# Patient Record
Sex: Male | Born: 1965 | Race: Black or African American | Hispanic: No | Marital: Single | Smoking: Never smoker
Health system: Southern US, Community
[De-identification: ages and names within clinical notes are randomized; demographics above are authoritative.]

## PROBLEM LIST (undated history)

## (undated) HISTORY — PX: FOOT SURGERY: SHX648

## (undated) HISTORY — PX: JOINT REPLACEMENT: SHX530

---

## 2013-11-15 MED ADMIN — OLANZapine (ZyPREXA) tablet 2.5 mg: 2.5 mg | ORAL | @ 21:00:00 | NDC 00904628361

## 2013-11-15 MED ADMIN — diazepam (VALIUM) tablet 5 mg: 5 mg | ORAL | @ 02:00:00 | NDC 51079028501

## 2020-01-21 ENCOUNTER — Emergency Department: Admit: 2020-01-21 | Discharge: 2020-01-21 | Payer: Self-pay

## 2020-01-21 DIAGNOSIS — S99912A Unspecified injury of left ankle, initial encounter: Secondary | ICD-10-CM

## 2020-01-21 DIAGNOSIS — M25772 Osteophyte, left ankle: Secondary | ICD-10-CM

## 2020-01-21 DIAGNOSIS — M778 Other enthesopathies, not elsewhere classified: Secondary | ICD-10-CM

## 2020-01-21 DIAGNOSIS — M25472 Effusion, left ankle: Secondary | ICD-10-CM

## 2020-01-21 LAB — ECG 12-LEAD
Atrial Rate: 111 {beats}/min
Calculated P Axis: 46 degrees
Calculated R Axis: -20 degrees
Calculated T Axis: 39 degrees
P-R Interval: 178 ms
QRS Duration: 86 ms
QT Interval: 318 ms
QTcb: 432 ms
Ventricular Rate: 111 {beats}/min

## 2020-01-21 MED ORDER — LORAZEPAM 1 MG TABLET
1 | Freq: Once | ORAL | Status: AC
Start: 2020-01-21 — End: 2020-01-21
  Administered 2020-01-21: 14:00:00 2 mg via ORAL

## 2020-01-21 MED ORDER — LORAZEPAM 1 MG TABLET
1 | Freq: Once | ORAL | Status: AC
Start: 2020-01-21 — End: 2020-01-21
  Administered 2020-01-21: 12:00:00 2 mg via ORAL

## 2020-01-21 MED ORDER — LORAZEPAM 1 MG TABLET
1 | Freq: Once | ORAL | Status: AC
Start: 2020-01-21 — End: 2020-01-21
  Administered 2020-01-21: 10:00:00 1 mg via ORAL

## 2020-01-21 MED FILL — LORAZEPAM 1 MG TABLET: 1 mg | ORAL | Qty: 2

## 2020-01-21 MED FILL — LORAZEPAM 1 MG TABLET: 1 mg | ORAL | Qty: 1

## 2020-01-21 NOTE — Unmapped (Signed)
Please use crutches for the next 4-6 weeks and bear weight as tolerated on your left foot. Please see a primary doctor or urgent care for re-evaluation to determine how your ankle is recovering.    If you're feeling down or anxious, please consider dropping in to [place] or [facility] this morning.
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
## 2020-01-22 MED ORDER — ALUMINUM-MAG HYDROXIDE-SIMETHICONE 200 MG-200 MG-20 MG/5 ML ORAL SUSP: 200-200-20 mg/5 mL | ORAL | Status: DC

## 2020-01-22 MED ORDER — ELECTROLYTE-A IV BOLUS
INTRAVENOUS | Status: AC
  Administered 2020-01-23: 08:00:00 1000 mL via INTRAVENOUS

## 2020-01-22 MED ORDER — FAMOTIDINE 20 MG TABLET
20 mg | ORAL | Status: AC
  Administered 2020-01-23: 08:00:00 20 mg via ORAL

## 2020-01-23 LAB — VENOUS BLOOD GAS W/LACTATE
Base excess: 6 mmol/L
Bicarbonate: 32 mmol/L — ABNORMAL HIGH (ref 23–29)
Calcium, Ionized, whole blood: 1.15 mmol/L (ref 1.13–1.27)
Chloride, whole blood: 104 mmol/L (ref 99–108)
Glucose, whole blood: 104 mg/dL (ref 70–199)
Hematocrit from Hb: 50 % (ref 41–53)
Hemoglobin, Whole Blood: 16.3 g/dL (ref 13.6–17.5)
Lactate, whole blood: 2.2 mmol/L — ABNORMAL HIGH (ref 0.5–2.0)
Oxygen Saturation: 44 % — ABNORMAL LOW (ref 95–100)
PCO2: 56 mm Hg — ABNORMAL HIGH (ref 32–46)
PO2: 28 mm Hg — CL (ref 83–108)
Potassium, Whole Blood: 4 mmol/L (ref 3.4–4.5)
Sodium, whole blood: 141 mmol/L (ref 136–146)
pH, Blood: 7.36 (ref 7.35–7.45)

## 2020-01-23 LAB — COMPREHENSIVE METABOLIC PANEL
AST: 34 U/L (ref 5–44)
Alanine transaminase: 25 U/L (ref 10–61)
Albumin, Serum / Plasma: 3.7 g/dL (ref 3.5–5.0)
Alkaline Phosphatase: 69 U/L (ref 38–108)
Anion Gap: 10 (ref 4–14)
Bilirubin, Total: 0.5 mg/dL (ref 0.2–1.2)
Calcium, total, Serum / Plasma: 8.9 mg/dL (ref 8.4–10.5)
Carbon Dioxide, Total: 26 mmol/L (ref 22–29)
Chloride, Serum / Plasma: 104 mmol/L (ref 101–110)
Creatinine: 0.95 mg/dL (ref 0.73–1.24)
Glucose, non-fasting: 108 mg/dL (ref 70–199)
Potassium, Serum / Plasma: 4.2 mmol/L (ref 3.5–5.0)
Protein, Total, Serum / Plasma: 7.2 g/dL (ref 6.3–8.6)
Sodium, Serum / Plasma: 140 mmol/L (ref 135–145)
Urea Nitrogen, Serum / Plasma: 7 mg/dL (ref 7–25)
eGFR - high estimate: 105 mL/min (ref >59)
eGFR - low estimate: 90 mL/min (ref >59)

## 2020-01-23 LAB — ED INFORMATION EXCHANGE ORDER: EDIE Other: 1

## 2020-01-23 LAB — URINALYSIS WITH REFLEX TO CULT
Bilirubin, Urine: NEGATIVE
Glucose, (UA): NEGATIVE mg/dL
Hemoglobin (UA): NEGATIVE
Ketones, UA: NEGATIVE mg/dL
Nitrite: NEGATIVE
Protein, UA: NEGATIVE mg/dL
Specific Gravity: 1.012 (ref 1.002–1.030)
Urobilinogen: NEGATIVE mg/dL(EU/dL)
WBC Esterase: NEGATIVE
pH, UA: 6 (ref 4.5–8.0)

## 2020-01-23 LAB — COMPLETE BLOOD COUNT WITH DIFF
Abs Basophils: 0.01 10*9/L (ref 0.00–0.10)
Abs Eosinophils: 0.22 10*9/L (ref 0.00–0.40)
Abs Imm Granulocytes: 0.01 10*9/L (ref <0.10)
Abs Lymphocytes: 1.7 10*9/L (ref 1.00–3.40)
Abs Monocytes: 0.93 10*9/L — ABNORMAL HIGH (ref 0.20–0.80)
Abs Neutrophils: 2.07 10*9/L (ref 1.80–6.80)
Hematocrit: 46.3 % (ref 41.0–53.0)
Hemoglobin: 15.1 g/dL (ref 13.6–17.5)
MCH: 31.1 pg (ref 26.0–34.0)
MCHC: 32.6 g/dL (ref 31.0–36.0)
MCV: 96 fL (ref 80–100)
Platelet Count: 136 10*9/L — ABNORMAL LOW (ref 140–450)
RBC Count: 4.85 10*12/L (ref 4.40–5.90)
WBC Count: 4.9 10*9/L (ref 3.4–10.0)

## 2020-01-23 LAB — LIPASE: Lipase: 26 U/L (ref 8–78)

## 2020-01-23 LAB — DRUGS OF ABUSE SCREEN, RAPID
Amphetamine Screen, Urine: POSITIVE — AB
Barbiturates Screen, Urine: NEGATIVE
Benzodiazepines Screen, Urine: NEGATIVE
Cocaine Screen, Urine: POSITIVE — AB
Opiates Screen, Urine: NEGATIVE
Tetrahydrocannabinol Screen, U: NEGATIVE

## 2020-01-23 LAB — ACETAMINOPHEN LEVEL: Acetaminophen: <1 mg/L — ABNORMAL LOW (ref 10–20)

## 2020-01-23 LAB — MAGNESIUM, SERUM / PLASMA: Magnesium, Serum / Plasma: 2 mg/dL (ref 1.6–2.6)

## 2020-01-23 LAB — THYROID STIMULATING HORMONE: Thyroid Stimulating Hormone: 1.51 mIU/L (ref 0.45–4.12)

## 2020-01-23 LAB — POCT COVID-19 RNA, QUALITATIVE: POCT COVID-19 RNA, Qualitative: NOT DETECTED

## 2020-01-23 LAB — SALICYLATE LEVEL: Salicylate: <5 mg/dL — ABNORMAL LOW (ref 10–30)

## 2020-01-23 LAB — TROPONIN I: Troponin I: 0.03 ug/L (ref 0.00–0.04)

## 2020-01-23 LAB — ETHANOL, SERUM OR PLASMA: Ethanol, serum or plasma: 0.065 g/dL — ABNORMAL HIGH (ref <0.010)

## 2020-01-23 MED ORDER — LORAZEPAM 1 MG TABLET
1 mg | ORAL | Status: AC
  Administered 2020-01-23: 09:00:00 1 mg via ORAL

## 2020-01-23 MED ORDER — RISPERIDONE 1 MG DISINTEGRATING TABLET
1 mg | ORAL | Status: AC
  Administered 2020-01-23: 08:00:00 2 mg via ORAL

## 2020-01-23 MED FILL — FAMOTIDINE 20 MG TABLET: 20 mg | ORAL | Qty: 1

## 2020-01-23 MED FILL — LORAZEPAM 1 MG TABLET: 1 mg | ORAL | Qty: 1

## 2020-01-23 MED FILL — RISPERIDONE 1 MG DISINTEGRATING TABLET: 1 mg | ORAL | Qty: 2

## 2020-01-23 NOTE — Interdisciplinary (Signed)
EDIE / Psych ED Social Work Note    Referral:   5150    Data:   Pt has presented to ED repeatedly in past several days reporting somatic symptoms and possible paranoid ideation; housed in [place] with his sister.    No Medi-cal or insurance currently    Assessment:   Patient is medically cleared. Re-evaluated by psychiatry today who are recommending discontinuation of the 5150 and follow up in the community.    SW spoke with patient to review insurance and follow-up plan. Patient was alert, calm, oriented x 4 and able to communicate well and in a linear fashion. Patient feeling quite stable and safe to d/c with no SI or HI.  Patient states he has begun the application process for [place]Cal and is in process of getting linked for outpatient psychiatry there in that county. Pt anticipates having active MediCal by 01/28/20. Patient was provided with emergency crisis lines and the [facility] [employer] for follow-up, and was appreciative of SW support in referring to services.     Action / Follow-up plan:   SW initiated referral with ACCESS team for [place] who took the info and will contact patient at his sister's home number to complete clinical review and schedule the appointment.    Pt provided with county resources, including crisis lines for 24/7 support, and with a BART ticket $6.80 value and a token to get back to his sister's home.       Catha Gosselin, LCSW  Emergency Department  Psychiatric Social Worker   Voalte: [phone] or Desk: [phone]
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
## 2020-01-23 NOTE — Other (Signed)
State of [employer] of Health Care Services  Health and Hospital doctor     APPLICATION FOR UP TO 72-HOUR ASSESSMENT, EVALUATION, AND CRISIS INTERVENTION OR PLACEMENT FOR EVALUATION AND TREATMENT  Confidential Client/Patient Information  Welfare and Institutions Code (W & I Code), section 5150 (g)(1), requires that each person, at the time they are first taken into custody under this section, shall be provided, by the person who takes them into custody, the following information orally in a language or modality accessible to the person. If the person cannot understand an oral advisement, the information shall be provided in writing. DETAINMENT ADVISEMENT  My name is ______________________________  I am a (peace officer/mental health professional) with (name of agency). You are not under criminal arrest, but I am taking you for examination by mental health professionals at (name of facility).    You will be told your rights by the mental health staff.     If taken into custody at his or her residence, the person shall also be told the following information:  You may bring a few personal items with you which I will have to approve.Please inform me if you need   [X]  Complete Advisement  [  ] Incomplete Advisement   assistance turning off any appliance or water. You may make a phone call and leave a note to tell your friends or family where you have been taken.   Date of Advisement/Attempt:   01/23/2020     Good Cause for Incomplete Advisement:   N/A          Advisement Completed/ Attempted By:     (Electronically signed by)  01/25/2020 Position:   Resident physician Language or Modality Used:  Spoken English                                        To (name of 5150 designated facility):  [facility] and [facility] at [facility]    Application is hereby made for the assessment and evaluation of Tyler Bailey,  date of birth of 10-02-1965, and residing at [address], [place], [place], [place], [address], for up to 72-hour assessment, evaluation, and crisis intervention, or placement for evaluation and treatment at a designated facility pursuant to Section 5150, et seq. (adult) or Section 5585 et seq. (minor), of the W&I Code.    If authorization for voluntary treatment is not available for a minor/conservatee, indicate to the best of your knowledge who has legal authority to make medical decisions on behalf of the minor/conservatee: (name and contact information, if available)      (Check one): [  ] Parent   [  ]  Legal Guardian  [  ]  Conservator   [  ]  Other:    _       Indicate to the best of your knowledge whether the minor is under the jurisdiction of the juvenile court:    (Check one):   [  ]  W&I Code 300 (dependent)   [  ]  W&I Code 601, 602 (ward)    The detained persons condition was called to my attention under the following circumstances: [facility] consultation to psychiatry. Additional details: Emergency department consult for paranoia/anxiety   Specific facts that I have considered that lead me to believe that this person is, as a result of a mental health disorder, a danger to others, a danger to self or gravely disabled:   Mr. Bruun has been unable to reliably keep himself out of the hospital over the past 72 hours due to ongoing intense paranoia, audio and visual hallucinations, and also endorses periodic SI without plan   [X]  I have considered the historical course of the person's mental disorder as follows: Patient has presented for emergency care each of the past 3 nights. He endorses a history of bipolar disorder and repeated psychiatric hospitalization in . He has not established care with a psychiatrist since moving back to the [facility].  [  ] No reasonable bearing on determination  [  ] No information available because:  OPTIONAL INFORMATION    History Provided by  (Name)                     Address      Phone Number          Relation                        Based upon the above information, there is probable cause to believe that said person is, as a result of mental health disorder:   [  ] Danger to Self (DTS)       [  ]  Danger to Others (DTO)       [X]  Gravely  Disabled (as defined in W&I Code section 5008 or 5585.25     NOTIFICATIONS TO BE PROVIDED PURSUANT TO SECTION 5152.1 AND/OR 8102 OF THE WELFARE AND INSTUTIONS CODE   Notify behavioral health director/designee: ________________________________________ (Name) (Phone) and peace officer/designee:      __________________________________________________(Name) (Phone) of persons release or end of detention if either of the boxes below are checked.     NOTIFICATION OF PERSONS RELEASE IS REQUESTED BY THE REFERRING PEACE OFFICER BECAUSE:     []   The person has been referred to the facility under circumstances which, based upon an allegation of facts regarding actions witnessed by the officer or another person, would support the filing of a criminal complaint.     []   Weapon was [facility] to Section 8102 W&I Code.     Signature, title and badge number of , professional person in charge of the facility designated by the county for evaluation and treatment, member of the attending staff, designated members of a mobile crisis team, or professional person designated by .    Name:  Date: 01/23/2020    Phone:  [phone]     Signature:  (Electronically signed by)  [employer] Title:  MD Eugenio Hoes      N/A Time:   1:29 AM    Name of Law Enforcement Agency or Evaluation Facility/Person:   01/25/2020 and Clin[facility] at [facility]   Address of Eugenio Hoes or Evaluation Facility/Person:   [address]  [place], [facility] Sales executive      REFERENCES   Notify (officer/unit & telephone #) N/A     Welfare  and Institutions Code    Sections: 981, 191, 478,  2956, 2130, 5150.05, 5152.1, 8657, [id number], 5585.50, [place]   Name of Individual Detained:    Tyler Bailey         DOB:    [birth year]      DHCS 1801      Please Note: A copy of this application shall be treated as the original. (Revised 08/2018)

## 2020-01-23 NOTE — ED Provider Notes (Signed)
ED Attendings              History     Chief Complaint   Patient presents with    Anxiety     pt biba 2/2 panic attack. -SI/HI       HPI  75yoM pmh AUD c/b pancreatitis? P/w abdominal pain, chest discomfort intermittent throughout day     Describes gets pressure and feels anxious    Abdominal pain and chest pain in same area  Intermittent  Sharp  Nothing improves  Anxiety episodes worsen  Believes his pancreatitis  Drinks 2 6 packs a day for the last several months  No shortness of breath, fevers  +emesis whatever he ate  No surgical abdominal history  +hematuria - streaks with some  Discomfort  Normal bowel movements    Occasional HI but does not say who  SI has tried to hurt self before but denies today  +paranoia, people trying to kill him why he left [place] and came in Jan  Lives with sister and feels safe - apt in [place]      Allergies/Contraindications  No Known Allergies             Medical History    Past Medical History   Diagnosis Date    Alcoholism     Exposure to hepatitis C     Medial epicondylitis of elbow     Osteoarthritis of wrist     Osteoarthrosis, unspecified whether generalized or localized, forearm     Osteoarthrosis, unspecified whether generalized or localized, lower leg     Vitamin D deficiency                  Social History     Socioeconomic History    Marital status: Single     Spouse name: Not on file    Number of children: Not on file    Years of education: Not on file    Highest education level: Not on file   Tobacco Use    Smoking status: Never Smoker    Smokeless tobacco: Never Used   Substance and Sexual Activity    Alcohol use: Yes     Alcohol/week: 18.0 standard drinks     Types: 18 Cans of beer per week     Comment: 3 6-packs daily.     Drug use: Yes     Types: Cocaine, Methamphetamine     Comment: smokes (prior cocaine)        Review of Systems     Review of Systems   Constitutional: Negative for chills and fever.   HENT: Negative for rhinorrhea and sore  throat.    Eyes: Negative for discharge and redness.   Respiratory: Negative for cough and shortness of breath.    Cardiovascular: Positive for chest pain. Negative for palpitations.   Gastrointestinal: Positive for abdominal pain and vomiting. Negative for blood in stool, constipation, diarrhea and nausea.   Genitourinary: Positive for dysuria and hematuria.   Skin: Negative for wound.   Neurological: Negative for dizziness and headaches.   Psychiatric/Behavioral: Negative for confusion, self-injury and suicidal ideas.       Physical Exam   Triage Vital Signs:  BP: (!) 152/98, Heart Rate: 112, Temp: 37 C (98.6 F), *Resp: 18, SpO2: 99 %    Physical Exam  Constitutional:       General: He is not in acute distress.     Appearance: He is not toxic-appearing.  HENT:      Head: Atraumatic.   Eyes:      Pupils: Pupils are equal, round, and reactive to light.   Cardiovascular:      Rate and Rhythm: Regular rhythm. Tachycardia present.      Pulses: Normal pulses.      Heart sounds: Normal heart sounds.   Pulmonary:      Effort: Pulmonary effort is normal. No tachypnea, accessory muscle usage or respiratory distress.      Breath sounds: No stridor. No decreased breath sounds or rales.   Chest:      Chest wall: No tenderness.   Abdominal:      General: Bowel sounds are normal. There is no distension.      Palpations: Abdomen is soft.      Tenderness: There is abdominal tenderness (mid epigastric).   Musculoskeletal:      Right lower leg: No edema.      Left lower leg: No edema.   Skin:     General: Skin is warm and dry.   Neurological:      Mental Status: He is alert and oriented to person, place, and time.   Psychiatric:      Comments: Possible paranoia, asks why in hallway, can he be somewhere more private  Describes people trying to kill him  Makes him anxious and gets these attacks            Initial Assessment (problem list and differential diagnosis)     53yoM pmh AUD c/b pancreatitis? P/w abdominal pain, chest  discomfort intermittent throughout day.    Abdominal pain.chest discomfort: gastritis, hepatitis, choley, pancreatitis, ACS, anxiety, thyroid disease. PUD possible but no signs of bleeding at this time. Doubt PE, dissection. No tremors, tongue wag doubt withdrawal    Paranoia: underlying psych, elicit drug use. Considered wernicke's but no confabulation, normal gait.     - labs, ekg  - psych consult for representation and HI with some paranoid thoughs    Interpretations:  Lab, Imaging, and ECG     Labs are pending.    As below       Reassessment and Final Disposition         ED Course as of Jan 23 1449   [facility] Documentation   Mon Jan 23, 2020   0002 Paged psych      [phone] Referrals OP psych. Has history bipolar disorder.   - Risperdal 2mg   - staffing with attending      0117 compensated   PCO2(!): 56   0119 Within patients range   Platelet Count(!): 136   0127 Psych: attending concerned cant get collateral, bounce back.   - placing 5150 gd      0433 Cocaine Screen, Urine(!): POS   0433 Amphetamine Screen, Urine(!): POS      Others' Documentation   Mon Jan 23, 2020   0712 S54M on 5150 for GD iso bipolar not on meds. Med cleared.  Psych reeval []       [EC]   5151 Cleared by psych. No meds. SW working on referral for Mental Health    [EC]      ED Course User Index  [EC] [id number], MD         [employer], MD  Resident  01/24/20 [address] [place], [address]  01/28/20 [phone]

## 2020-01-23 NOTE — Other (Signed)
State of [place] - Health and Investment banker, corporate of Health Care Services   INVOLUNTARY PATIENT ADVISEMENT  (TO BE READ AND GIVEN TO THE  PATIENT AT TIME OF ADMISSION) Confidential Patient Information   Name of Facility     [facility] at [place]   Patients Name     Tyler Bailey Admission Date:     01/23/2020   Section 5150(h) of the Welfare and Institutions Code requires that each person admitted to a facility designated by [employer] for evaluation and treatment be given specific information orally and in writing, and in a language or modality accessible to the person and a record of the advisement be kept in the persons medical record.   My name is Eugenio Hoes My position here is Resident physician   You are being placed in this psychiatric facility because it is our professional opinion, that as a result of a mental health disorder, you are likely to: (check applicable)   [  ] Harm yourself [  ] Harm someone else [X]  Be unable to be take care of your own food clothing or shelter   (List specific facts upon which the allegation of dangerous or gravely disabled due to mental health disorder is based, including pertinent facts arising from the admission interview):   We believe this is true because  You describe intense feelings of anxiety and audio/visual hallucinations that have resulted in your visiting the emergency room repeatedly and interfered with your ability to take care of yourself outside the hospital   You will be held for a period of up to 72 hours. This ( [x]  does ) ( []  does not) include weekends or holidays.   Your 72-hour period begins at:      01:29 AM   on:          01/23/2020                                                             (Time)                (Date)       You will be held for a period up to 72 hours. During the 72 hours you may also be transferred to another facility. You may request to be evaluated or treated at a  facility of your choice. You may request to be evaluated or treated by a mental health professional of your choice. We cannot guarantee the facility or mental health professional you choose will be available, but we will honor your choice if we can.     During these 72 hours you will be evaluated by the facility staff, and you may be given treatment, including medications. It is possible for you to be released before the end of the 72 hours. But if the staff decides that you need continued treatment you can be held for a longer period of time. If you are held longer than 72 hours, you have the right to a lawyer and a qualified interpreter and a hearing before a judge. If you are unable to pay for the lawyer, then one will be provided to you free of charge.     If you have questions about your legal rights, you may  contact the county Patients Rights Advocate at [phone] or [phone] (phone number of county Patients Rights Advocacy Office).   Advisement Completed or Attempted by:  (Electronically signed by)  Solon Palm Position:  Resident physician Language or Modality Used:  Spoken English    Good cause for Incomplete Advisement N/A Date of Advisement:  01/23/20   CC: Original to the Patient   DHCS 1802 (08/2018) Copy to the Patients Record

## 2020-01-24 LAB — ECG 12-LEAD
Atrial Rate: 99 {beats}/min
Calculated P Axis: 49 degrees
Calculated R Axis: -25 degrees
Calculated T Axis: 35 degrees
P-R Interval: 164 ms
QRS Duration: 86 ms
QT Interval: 364 ms
QTcb: 467 ms
Ventricular Rate: 99 {beats}/min

## 2020-03-09 ENCOUNTER — Emergency Department: Admit: 2020-03-09 | Discharge: 2020-03-09 | Payer: MEDICAID

## 2020-03-09 ENCOUNTER — Inpatient Hospital Stay: Admit: 2020-03-09 | Discharge: 2020-03-09 | Payer: MEDICAID

## 2020-03-09 ENCOUNTER — Inpatient Hospital Stay
Admit: 2020-03-09 | Discharge: 2020-03-11 | Discharge status: Against Medical Advice | Payer: MEDICAID | Attending: Physician | Admitting: Physician

## 2020-03-09 DIAGNOSIS — F2081 Schizophreniform disorder: Secondary | ICD-10-CM

## 2020-03-09 DIAGNOSIS — F10239 Alcohol dependence with withdrawal, unspecified: Secondary | ICD-10-CM

## 2020-03-09 DIAGNOSIS — R45851 Suicidal ideations: Secondary | ICD-10-CM

## 2020-03-09 DIAGNOSIS — F29 Unspecified psychosis not due to a substance or known physiological condition: Secondary | ICD-10-CM

## 2020-03-09 DIAGNOSIS — Z205 Contact with and (suspected) exposure to viral hepatitis: Secondary | ICD-10-CM

## 2020-03-09 DIAGNOSIS — Z818 Family history of other mental and behavioral disorders: Secondary | ICD-10-CM

## 2020-03-09 DIAGNOSIS — R1013 Epigastric pain: Secondary | ICD-10-CM

## 2020-03-09 DIAGNOSIS — M19039 Primary osteoarthritis, unspecified wrist: Secondary | ICD-10-CM

## 2020-03-09 DIAGNOSIS — M1711 Unilateral primary osteoarthritis, right knee: Secondary | ICD-10-CM

## 2020-03-09 DIAGNOSIS — Z20822 Contact with and (suspected) exposure to covid-19: Secondary | ICD-10-CM

## 2020-03-09 DIAGNOSIS — F10230 Alcohol dependence with withdrawal, uncomplicated: Secondary | ICD-10-CM

## 2020-03-09 DIAGNOSIS — I10 Essential (primary) hypertension: Secondary | ICD-10-CM

## 2020-03-09 DIAGNOSIS — R000 Tachycardia, unspecified: Secondary | ICD-10-CM

## 2020-03-09 DIAGNOSIS — R079 Chest pain, unspecified: Secondary | ICD-10-CM

## 2020-03-09 DIAGNOSIS — F10231 Alcohol dependence with withdrawal delirium: Secondary | ICD-10-CM

## 2020-03-09 DIAGNOSIS — R9431 Abnormal electrocardiogram [ECG] [EKG]: Secondary | ICD-10-CM

## 2020-03-09 DIAGNOSIS — F1020 Alcohol dependence, uncomplicated: Secondary | ICD-10-CM

## 2020-03-09 DIAGNOSIS — D6959 Other secondary thrombocytopenia: Secondary | ICD-10-CM

## 2020-03-09 DIAGNOSIS — F419 Anxiety disorder, unspecified: Secondary | ICD-10-CM

## 2020-03-09 DIAGNOSIS — K2921 Alcoholic gastritis with bleeding: Secondary | ICD-10-CM

## 2020-03-09 DIAGNOSIS — K5900 Constipation, unspecified: Secondary | ICD-10-CM

## 2020-03-09 DIAGNOSIS — F39 Unspecified mood [affective] disorder: Secondary | ICD-10-CM

## 2020-03-09 DIAGNOSIS — D7589 Other specified diseases of blood and blood-forming organs: Secondary | ICD-10-CM

## 2020-03-09 DIAGNOSIS — F14120 Cocaine abuse with intoxication, uncomplicated: Secondary | ICD-10-CM

## 2020-03-09 DIAGNOSIS — E559 Vitamin D deficiency, unspecified: Secondary | ICD-10-CM

## 2020-03-09 DIAGNOSIS — F319 Bipolar disorder, unspecified: Secondary | ICD-10-CM

## 2020-03-09 DIAGNOSIS — G5603 Carpal tunnel syndrome, bilateral upper limbs: Secondary | ICD-10-CM

## 2020-03-09 DIAGNOSIS — F15120 Other stimulant abuse with intoxication, uncomplicated: Secondary | ICD-10-CM

## 2020-03-09 DIAGNOSIS — F458 Other somatoform disorders: Secondary | ICD-10-CM

## 2020-03-09 LAB — VENOUS BLOOD GAS W/LACTATE
Base excess: 0 mmol/L
Base excess: 4 mmol/L
Bicarbonate: 23 mmol/L (ref 23–29)
Bicarbonate: 30 mmol/L — ABNORMAL HIGH (ref 23–29)
Calcium, Ionized, whole blood: 1.17 mmol/L (ref 1.13–1.27)
Calcium, Ionized, whole blood: 1.14 mmol/L (ref 1.13–1.27)
Chloride, whole blood: 102 mmol/L (ref 99–108)
Chloride, whole blood: 100 mmol/L (ref 99–108)
Date Called:: 20210611
Glucose, whole blood: 83 mg/dL (ref 70–199)
Glucose, whole blood: 78 mg/dL (ref 70–199)
Hematocrit from Hb: 47 % (ref 41–53)
Hematocrit from Hb: 45 % (ref 41–53)
Hemoglobin, Whole Blood: 15.4 g/dL (ref 13.6–17.5)
Hemoglobin, Whole Blood: 14.8 g/dL (ref 13.6–17.5)
Lactate, whole blood: 6 mmol/L — ABNORMAL HIGH (ref 0.5–2.0)
Lactate, whole blood: 1.4 mmol/L (ref 0.5–2.0)
Oxygen Saturation: 86 % — ABNORMAL LOW (ref 95–100)
Oxygen Saturation: 85 % — ABNORMAL LOW (ref 95–100)
PCO2: 30 mm Hg — ABNORMAL LOW (ref 32–46)
PCO2: 52 mm Hg — ABNORMAL HIGH (ref 32–46)
PO2: 47 mm Hg — ABNORMAL LOW (ref 83–108)
PO2: 53 mm Hg — ABNORMAL LOW (ref 83–108)
Potassium, Whole Blood: 3.9 mmol/L (ref 3.4–4.5)
Potassium, Whole Blood: 3.8 mmol/L (ref 3.4–4.5)
Sodium, whole blood: 140 mmol/L (ref 136–146)
Sodium, whole blood: 138 mmol/L (ref 136–146)
Time Called:: 71000
pH, Blood: 7.49 — ABNORMAL HIGH (ref 7.35–7.45)
pH, Blood: 7.36 (ref 7.35–7.45)

## 2020-03-09 LAB — PROTHROMBIN TIME
Int'l Normaliz Ratio: 1.1 (ref 0.9–1.2)
PT: 13.7 s (ref 11.7–14.9)

## 2020-03-09 LAB — COMPREHENSIVE METABOLIC PANEL
AST: 28 U/L (ref 5–44)
Alanine transaminase: 30 U/L (ref 10–61)
Albumin, Serum / Plasma: 4.2 g/dL (ref 3.5–5.0)
Alkaline Phosphatase: 62 U/L (ref 38–108)
Anion Gap: 17 — ABNORMAL HIGH (ref 4–14)
Bilirubin, Total: 0.5 mg/dL (ref 0.2–1.2)
Calcium, total, Serum / Plasma: 9.4 mg/dL (ref 8.4–10.5)
Carbon Dioxide, Total: 18 mmol/L — ABNORMAL LOW (ref 22–29)
Chloride, Serum / Plasma: 103 mmol/L (ref 101–110)
Creatinine: 1.01 mg/dL (ref 0.73–1.24)
Glucose, non-fasting: 83 mg/dL (ref 70–199)
Potassium, Serum / Plasma: 4.1 mmol/L (ref 3.5–5.0)
Protein, Total, Serum / Plasma: 7.5 g/dL (ref 6.3–8.6)
Sodium, Serum / Plasma: 138 mmol/L (ref 135–145)
Urea Nitrogen, Serum / Plasma: 9 mg/dL (ref 7–25)
eGFR - high estimate: 97 mL/min (ref >59)
eGFR - low estimate: 84 mL/min (ref >59)

## 2020-03-09 LAB — COVID-19 RNA, RT-PCR/NUCLEIC A: COVID-19 RNA, RT-PCR/Nucleic A: NOT DETECTED

## 2020-03-09 LAB — TROPONIN I: Troponin I: <0.02 ug/L (ref 0.00–0.04)

## 2020-03-09 LAB — COMPLETE BLOOD COUNT WITH DIFF
Abs Basophils: 0.02 10*9/L (ref 0.00–0.10)
Abs Eosinophils: 0.01 10*9/L (ref 0.00–0.40)
Abs Imm Granulocytes: 0.02 10*9/L (ref <0.10)
Abs Lymphocytes: 1.81 10*9/L (ref 1.00–3.40)
Abs Monocytes: 1.05 10*9/L — ABNORMAL HIGH (ref 0.20–0.80)
Abs Neutrophils: 3.86 10*9/L (ref 1.80–6.80)
Hematocrit: 45.1 % (ref 41.0–53.0)
Hemoglobin: 15 g/dL (ref 13.6–17.5)
MCH: 32.1 pg (ref 26.0–34.0)
MCHC: 33.3 g/dL (ref 31.0–36.0)
MCV: 96 fL (ref 80–100)
Platelet Count: 121 10*9/L — ABNORMAL LOW (ref 140–450)
RBC Count: 4.68 10*12/L (ref 4.40–5.90)
WBC Count: 6.8 10*9/L (ref 3.4–10.0)

## 2020-03-09 LAB — DRUGS OF ABUSE SCREEN, RAPID
Amphetamine Screen, Urine: POSITIVE — AB
Barbiturates Screen, Urine: NEGATIVE
Benzodiazepines Screen, Urine: NEGATIVE
Cocaine Screen, Urine: POSITIVE — AB
Opiates Screen, Urine: NEGATIVE
Tetrahydrocannabinol Screen, U: NEGATIVE

## 2020-03-09 LAB — LIPASE: Lipase: 16 U/L (ref 8–78)

## 2020-03-09 LAB — ED INFORMATION EXCHANGE ORDER: EDIE Other: 1

## 2020-03-09 LAB — MAGNESIUM, SERUM / PLASMA: Magnesium, Serum / Plasma: 1.7 mg/dL (ref 1.6–2.6)

## 2020-03-09 MED ORDER — DEXTROSE 5 % AND 0.9 % NACL IV BOLUS
Freq: Once | INTRAVENOUS | Status: AC
Start: 2020-03-09 — End: 2020-03-09
  Administered 2020-03-09: 21:00:00 1000 mL via INTRAVENOUS

## 2020-03-09 MED ORDER — LORAZEPAM 2 MG/ML INJECTION SOLUTION
2 mg/mL | INTRAMUSCULAR | Status: AC
  Administered 2020-03-09: 15:00:00 2 mg via INTRAVENOUS

## 2020-03-09 MED ORDER — FOLIC ACID 1 MG TABLET
1 mg | ORAL | Status: DC
  Administered 2020-03-10: 16:00:00 1 mg via ORAL

## 2020-03-09 MED ORDER — THIAMINE HCL (VITAMIN B1) 100 MG/ML INJECTION SOLUTION
100 mg/mL | Freq: Three times a day (TID) | INTRAMUSCULAR | Status: DC
Start: 2020-03-09 — End: 2020-03-10
  Administered 2020-03-10 – 2020-03-11 (×4): 500 mg via INTRAVENOUS

## 2020-03-09 MED ORDER — IOHEXOL 350 MG IODINE/ML INTRAVENOUS SOLUTION
350 | Freq: Once | INTRAVENOUS | Status: AC
Start: 2020-03-09 — End: 2020-03-09
  Administered 2020-03-09: 150 mL via INTRAVENOUS

## 2020-03-09 MED ORDER — ELECTROLYTE-A IV BOLUS
INTRAVENOUS | Status: AC
  Administered 2020-03-09: 15:00:00 1000 mL via INTRAVENOUS

## 2020-03-09 MED ORDER — THIAMINE HCL (VITAMIN B1) 100 MG/ML INJECTION SOLUTION
100 mg/mL | INTRAMUSCULAR | Status: AC
  Administered 2020-03-09: 16:00:00 100 mg via INTRAVENOUS

## 2020-03-09 MED ORDER — ONDANSETRON HCL (PF) 4 MG/2 ML INJECTION SOLUTION
4 mg/2 mL | INTRAMUSCULAR | Status: AC | PRN
  Administered 2020-03-09: 15:00:00 4 mg via INTRAVENOUS

## 2020-03-09 MED ORDER — SENNOSIDES 8.6 MG TABLET
8.6 mg | ORAL | Status: DC
  Administered 2020-03-10: 05:00:00 17.2 mg via ORAL

## 2020-03-09 MED ORDER — FAMOTIDINE (PF) 20 MG/2 ML INTRAVENOUS SOLUTION
20 mg/2 mL | INTRAVENOUS | Status: AC
  Administered 2020-03-09: 16:00:00 20 mg via INTRAVENOUS

## 2020-03-09 MED ORDER — LORAZEPAM 2 MG/ML INJECTION SOLUTION: 2 mg/mL | INTRAMUSCULAR | Status: DC | PRN

## 2020-03-09 MED ORDER — GABAPENTIN 300 MG CAPSULE
300 mg | ORAL | Status: AC
  Administered 2020-03-09 – 2020-03-10 (×2): 300 mg via ORAL

## 2020-03-09 MED ORDER — HYDROMORPHONE (PF) 0.5 MG/0.5 ML INJECTION SYRINGE
0.5 | INTRAMUSCULAR | Status: AC
Start: 2020-03-09 — End: 2020-03-09

## 2020-03-09 MED ORDER — LORAZEPAM 2 MG/ML INJECTION SOLUTION
2 | INTRAMUSCULAR | Status: AC
Start: 2020-03-09 — End: 2020-03-09

## 2020-03-09 MED ORDER — RISPERIDONE 0.5 MG DISINTEGRATING TABLET: 0.5 mg | ORAL | Status: DC | PRN

## 2020-03-09 MED ORDER — KETOROLAC 15 MG/ML INJECTION SOLUTION
15 mg/mL | INTRAMUSCULAR | Status: AC
  Administered 2020-03-09: 21:00:00 15 mg via INTRAVENOUS

## 2020-03-09 MED ORDER — THIAMINE HCL (VITAMIN B1) 100 MG/ML INJECTION SOLUTION
100 | INTRAMUSCULAR | Status: DC
Start: 2020-03-09 — End: 2020-03-10

## 2020-03-09 MED ORDER — SODIUM CHLORIDE 0.9 % (FLUSH) INJECTION SYRINGE
0.9 % | INTRAMUSCULAR | Status: DC
  Administered 2020-03-10 (×2): 3 mL via INTRAVENOUS

## 2020-03-09 MED ORDER — HYDROMORPHONE (PF) 0.5 MG/0.5 ML INJECTION SYRINGE
0.5 mg/0.5 mL | INTRAMUSCULAR | Status: AC | PRN
  Administered 2020-03-09 (×2): 1 mg via INTRAVENOUS

## 2020-03-09 MED ORDER — LORAZEPAM 2 MG/ML INJECTION SOLUTION
2 mg/mL | INTRAMUSCULAR | Status: DC | PRN
  Administered 2020-03-10: 02:00:00 2 mg via INTRAVENOUS
  Administered 2020-03-10 (×3): 1 mg via INTRAVENOUS

## 2020-03-09 MED ORDER — THIAMINE MONONITRATE (VITAMIN B1) 100 MG TABLET: 100 mg | ORAL | Status: DC

## 2020-03-09 MED ORDER — PANTOPRAZOLE 40 MG INTRAVENOUS SOLUTION
4 mg/mL | INTRAVENOUS | Status: DC
  Administered 2020-03-10: 16:00:00 40 mg via INTRAVENOUS

## 2020-03-09 MED ORDER — MULTIVITAMIN WITH FOLIC ACID 400 MCG TABLET
ORAL | Status: DC
  Administered 2020-03-10: 16:00:00 1 via ORAL

## 2020-03-09 MED ORDER — ACETAMINOPHEN 325 MG TABLET
325 mg | ORAL | Status: DC | PRN
  Administered 2020-03-10: 12:00:00 650 mg via ORAL

## 2020-03-09 MED ORDER — QUETIAPINE 50 MG TABLET
50 mg | ORAL | Status: DC | PRN
  Administered 2020-03-09 – 2020-03-11 (×2): 50 mg via ORAL

## 2020-03-09 MED ORDER — ONDANSETRON HCL (PF) 4 MG/2 ML INJECTION SOLUTION
4 | INTRAMUSCULAR | Status: AC
Start: 2020-03-09 — End: 2020-03-09

## 2020-03-09 MED ORDER — ENOXAPARIN 40 MG/0.4 ML SUBCUTANEOUS SYRINGE: 40 mg/0.4 mL | SUBCUTANEOUS | Status: DC

## 2020-03-09 MED ORDER — SODIUM CHLORIDE 0.9 % (FLUSH) INJECTION SYRINGE: 0.9 % | INTRAMUSCULAR | Status: DC | PRN

## 2020-03-09 MED ORDER — LORAZEPAM 2 MG/ML INJECTION SOLUTION
2 mg/mL | INTRAMUSCULAR | Status: AC
  Administered 2020-03-09: 22:00:00 2 mg via INTRAVENOUS

## 2020-03-09 MED ORDER — FOLIC ACID 5 MG/ML INJECTION SOLUTION
5 mg/mL | INTRAMUSCULAR | Status: AC
  Administered 2020-03-09: 18:00:00 1 mg via INTRAVENOUS

## 2020-03-09 MED ORDER — RISPERIDONE 1 MG DISINTEGRATING TABLET
1 mg | ORAL | Status: DC
  Administered 2020-03-09: 18:00:00 1 mg via ORAL

## 2020-03-09 MED ORDER — LORAZEPAM 1 MG TABLET
1 mg | ORAL | Status: DC | PRN
  Administered 2020-03-10: 18:00:00 1 mg via ORAL

## 2020-03-09 MED ORDER — PANTOPRAZOLE 40 MG INTRAVENOUS SOLUTION
4 mg/mL | INTRAVENOUS | Status: AC
  Administered 2020-03-10: 80 mg via INTRAVENOUS

## 2020-03-09 MED ORDER — POLYETHYLENE GLYCOL 3350 17 GRAM ORAL POWDER PACKET: 17 gram | ORAL | Status: DC | PRN

## 2020-03-09 MED ORDER — LORAZEPAM 1 MG TABLET: 1 mg | ORAL | Status: DC | PRN

## 2020-03-09 MED ORDER — ELECTROLYTE-A IV BOLUS
INTRAVENOUS | Status: AC
  Administered 2020-03-09: 18:00:00 1000 mL via INTRAVENOUS

## 2020-03-09 MED ORDER — GABAPENTIN 300 MG CAPSULE
300 mg | ORAL | Status: DC
  Administered 2020-03-10 (×2): 600 mg via ORAL

## 2020-03-09 MED ORDER — RISPERIDONE 1 MG TABLET
1 mg | ORAL | Status: DC
  Administered 2020-03-10 (×2): 1 mg via ORAL

## 2020-03-09 MED ORDER — LORAZEPAM 2 MG/ML INJECTION SOLUTION
2 mg/mL | INTRAMUSCULAR | Status: AC
  Administered 2020-03-09: 16:00:00 2 mg via INTRAVENOUS

## 2020-03-09 MED ORDER — HYDROMORPHONE (PF) 0.5 MG/0.5 ML INJECTION SYRINGE
0.5 mg/0.5 mL | INTRAMUSCULAR | Status: DC | PRN
  Administered 2020-03-09: 22:00:00 1 mg via INTRAVENOUS

## 2020-03-09 MED FILL — DILAUDID (PF) 0.5 MG/0.5 ML INJECTION SYRINGE: 0.5 mg/ mL | INTRAMUSCULAR | Qty: 1

## 2020-03-09 MED FILL — QUETIAPINE 25 MG TABLET: 25 mg | ORAL | Qty: 2

## 2020-03-09 MED FILL — THIAMINE HCL (VITAMIN B1) 100 MG/ML INJECTION SOLUTION: 100 mg/mL | INTRAMUSCULAR | Qty: 2

## 2020-03-09 MED FILL — LORAZEPAM 2 MG/ML INJECTION SOLUTION: 2 mg/mL | INTRAMUSCULAR | Qty: 1

## 2020-03-09 MED FILL — KETOROLAC 15 MG/ML INJECTION SOLUTION: 15 mg/mL | INTRAMUSCULAR | Qty: 1

## 2020-03-09 MED FILL — GABAPENTIN 300 MG CAPSULE: 300 mg | ORAL | Qty: 1

## 2020-03-09 MED FILL — FOLIC ACID 5 MG/ML INJECTION SOLUTION: 5 mg/mL | INTRAMUSCULAR | Qty: 0.2

## 2020-03-09 MED FILL — THIAMINE HCL (VITAMIN B1) 100 MG/ML INJECTION SOLUTION: 100 mg/mL | INTRAMUSCULAR | Qty: 5

## 2020-03-09 MED FILL — FAMOTIDINE (PF) 20 MG/2 ML INTRAVENOUS SOLUTION: 20 mg/2 mL | INTRAVENOUS | Qty: 2

## 2020-03-09 MED FILL — PANTOPRAZOLE 40 MG INTRAVENOUS SOLUTION: 4 mg/mL | INTRAVENOUS | Qty: 20

## 2020-03-09 MED FILL — THIAMINE HCL (VITAMIN B1) 100 MG/ML INJECTION SOLUTION: 100 mg/mL | INTRAMUSCULAR | Qty: 2.5

## 2020-03-09 MED FILL — ONDANSETRON HCL (PF) 4 MG/2 ML INJECTION SOLUTION: 4 mg/2 mL | INTRAMUSCULAR | Qty: 2

## 2020-03-09 MED FILL — RISPERIDONE 1 MG DISINTEGRATING TABLET: 1 mg | ORAL | Qty: 1

## 2020-03-09 NOTE — Other (Signed)
State of [place] - Health and Investment banker, corporate of Health Care Services   INVOLUNTARY PATIENT ADVISEMENT  (TO BE READ AND GIVEN TO THE  PATIENT AT TIME OF ADMISSION) Confidential Patient Information   Name of Facility     [facility] [place]   Patients Name     Tyler Bailey Admission Date:     03/09/20   Section 5150(h) of the Welfare and Institutions Code requires that each person admitted to a facility designated by [employer] for evaluation and treatment be given specific information orally and in writing, and in a language or modality accessible to the person and a record of the advisement be kept in the persons medical record.   My name is [name] [name] My position here is Resident physician   You are being placed in this psychiatric facility because it is our professional opinion, that as a result of a mental health disorder, you are likely to: (check applicable)   [X]  Harm yourself [  ] Harm someone else [  ] Be unable to be take care of your own food clothing or shelter   (List specific facts upon which the allegation of dangerous or gravely disabled due to mental health disorder is based, including pertinent facts arising from the admission interview):   We believe this is true because  you are having constant thoughts of wanting to die.   You will be held for a period of up to 72 hours. This ( [x]  does ) ( []  does not) include weekends or holidays.   Your 72-hour period begins at:      6:19 AM   on:          03/09/20                                                             (Time)                (Date)       You will be held for a period up to 72 hours. During the 72 hours you may also be transferred to another facility. You may request to be evaluated or treated at a facility of your choice. You may request to be evaluated or treated by a mental health professional of your choice. We cannot guarantee the facility or mental health  professional you choose will be available, but we will honor your choice if we can.     During these 72 hours you will be evaluated by the facility staff, and you may be given treatment, including medications. It is possible for you to be released before the end of the 72 hours. But if the staff decides that you need continued treatment you can be held for a longer period of time. If you are held longer than 72 hours, you have the right to a lawyer and a qualified interpreter and a hearing before a judge. If you are unable to pay for the lawyer, then one will be provided to you free of charge.     If you have questions about your legal rights, you may contact the county Patients Rights Advocate at [phone] or [phone] (phone number of county Patients Rights Advocacy Office).   Advisement Completed or  Attempted by:  (Electronically signed by)  Toy Cookey Position:  Resident physician Language or Modality Used:  Spoken English    Good cause for Incomplete Advisement N/A Date of Advisement:  03/09/20   CC: Original to the Patient   DHCS 1802 (08/2018) Copy to the Patients Record

## 2020-03-09 NOTE — Other (Signed)
State of [place] and Development worker, community     APPLICATION FOR UP TO 42-VZDG ASSESSMENT, EVALUATION, AND CRISIS INTERVENTION OR PLACEMENT FOR EVALUATION AND TREATMENT  Confidential Client/Patient Information  Welfare and Institutions Code (W & I Code), section 3875 (g)(1), requires that each person, at the time they are first taken into custody under this section, shall be provided, by the person who takes them into custody, the following information orally in a language or modality accessible to the person. If the person cannot understand an oral advisement, the information shall be provided in writing. DETAINMENT ADVISEMENT  My name is ______________________________  I am a (peace officer/mental health professional) with (name of agency). You are not under criminal arrest, but I am taking you for examination by mental health professionals at (name of facility).    You will be told your rights by the mental health staff.     If taken into custody at his or her residence, the person shall also be told the following information:  You may bring a few personal items with you which I will have to approve.Please inform me if you need   [X]  Complete Advisement  [  ] Incomplete Advisement   assistance turning off any appliance or water. You may make a phone call and leave a note to tell your friends or family where you have been taken.   Date of Advisement/Attempt:   03/09/2020     Good Cause for Incomplete Advisement:   N/A          Advisement Completed/ Attempted By:     (Electronically signed by)  Tyler Bailey Position:   Resident physician Language or Modality Used:  Spoken English                                        To (name of 5150 designated facility):  [facility] and [facility] at [facility]    Application is hereby made for the assessment and evaluation of Tyler Bailey, date of birth of 25-Jun-1966, and residing at [address], [place], [place], [place], [place], for up to 72-hour assessment, evaluation, and crisis intervention, or placement for evaluation and treatment at a designated facility pursuant to Section 5150, et seq. (adult) or Section 5585 et seq. (minor), of the W&I Code.    If authorization for voluntary treatment is not available for a minor/conservatee, indicate to the best of your knowledge who has legal authority to make medical decisions on behalf of the minor/conservatee: (name and contact information, if available)      (Check one): [  ] Parent   [  ]  Legal Guardian  [  ]  Conservator   [  ]  Other:    _       Indicate to the best of your knowledge whether the minor is under the jurisdiction of the juvenile court:    (Check one):   [  ]  W&I Code 300 (dependent)   [  ]  W&I Code 601, 602 (ward)    The detained persons condition was called to my attention under the following circumstances: [facility] consultation to psychiatry. Additional details: self-presented to [place] ED with suicidal ideation and anxiety   Specific facts that I have considered that lead me to believe that this person is, as a result of a mental health disorder, a danger to others, a danger to self or gravely disabled:   Tyler Bailey reports strong desire to die with thoughts of jumping in front of a car or jumping into the ocean. He does not feel he can keep himself safe in the community.   [X]  I have considered the historical course of the person's mental disorder as follows: previous diagnosis of bipolar disorder, multiple previous hospitalizations for depression and suicide attempts  [  ] No reasonable bearing on determination  [  ] No information available because:  OPTIONAL INFORMATION    History Provided by (Name)                     Address      Phone Number          Relation                        Based upon the above information, there is probable cause  to believe that said person is, as a result of mental health disorder:   [X]  Danger to Self (DTS)       [  ]  Danger to Others (DTO)       [  ] Gravely  Disabled (as defined in W&I Code section 5008 or 5585.25     NOTIFICATIONS TO BE PROVIDED PURSUANT TO SECTION 5152.1 AND/OR 8102 OF THE WELFARE AND INSTUTIONS CODE   Notify behavioral health director/designee: ________________________________________ (Name) (Phone) and peace officer/designee:      __________________________________________________(Name) (Phone) of persons release or end of detention if either of the boxes below are checked.     NOTIFICATION OF PERSONS RELEASE IS REQUESTED BY THE REFERRING PEACE OFFICER BECAUSE:     []   The person has been referred to the facility under circumstances which, based upon an allegation of facts regarding actions witnessed by the officer or another person, would support the filing of a criminal complaint.     []   Weapon was to Section 8102 W&I Code.     Signature, title and badge number of , professional person in charge of the facility designated by the county for evaluation and treatment, member of the attending staff, designated members of a mobile crisis team, or professional person designated by .    Name:  Date: 03/09/2020     Phone:  [phone]     Signature:  (Electronically signed by)  Tyler Bailey Title:  MD Tyler Bailey      N/A Time:   6:19 AM    Name of Law Enforcement Agency or Evaluation Facility/Person:   05/09/2020 and Clin[facility] at [facility]   Address of Tyler Bailey or Evaluation Facility/Person:   [address]  [place], [facility] Sales executive      REFERENCES   Notify (officer/unit & telephone #) N/A     Welfare and Institutions Code    Sections: 300, 601, 602, 5008, 5150, 5150.05, 5152.1, 5328, 5585.25, 5585.50, 8102   Name of Individual Detained:  Tyler Bailey         DOB:    1966-05-24       DHCS 1801      Please Note: A copy of this application shall be treated as the original. (Revised 08/2018)

## 2020-03-09 NOTE — ED Provider Notes (Signed)
ED Attendings              History     Chief Complaint   Patient presents with    Anxiety     Woke up feeling anxious, hx depression/anxiety, has been anxiety x past couple months, lost meds.  Having some chest tightness, tingling in fingers & hyperventilating.  Cardiac hx, neg 12-lead in field.    Suicidal     Endorses SI enroute, no specific plan.  + etoh & marijuana.         HPI  Tyler Bailey with PMH of AUD, depression, and anxiety presents with anxiety, SI, and chest pain   - patient endorses severe anxiety with plan to jump out of car  - denies HI or AVH or ingestion   - hx of withdrawal, last drink at 8pm last night  - had some epigastric pain last night, woke up with severe, sharp pain radiating to chest and back that this morning worse with food or water  - n/v with minimal blood and associated subjective fever   - constipated over last 3 days, no melena   - denies cough, shortness of breath, le edema   - endorses remote cardiac history in the 90s  - last meth and fentanyl use 3 months ago         Allergies/Contraindications  No Known Allergies             Medical History    Past Medical History   Diagnosis Date    Alcoholism     Exposure to hepatitis C     Medial epicondylitis of elbow     Osteoarthritis of wrist     Osteoarthrosis, unspecified whether generalized or localized, forearm     Osteoarthrosis, unspecified whether generalized or localized, lower leg     Vitamin D deficiency                  Social History     Socioeconomic History    Marital status: Single     Spouse name: Not on file    Number of children: Not on file    Years of education: Not on file    Highest education level: Not on file   Tobacco Use    Smoking status: Never Smoker    Smokeless tobacco: Never Used   Substance and Sexual Activity    Alcohol use: Yes     Alcohol/week: 18.0 standard drinks     Types: 18 Cans of beer per week     Comment: 3 6-packs daily.     Drug use: Yes     Types: Cocaine, Methamphetamine     Comment:  smokes (prior cocaine)      Social Determinants of Health     Financial Resource Strain:     Difficulty of Paying Living Expenses:    Food Insecurity:     Worried About Charity fundraiser in the Last Year:     Arboriculturist in the Last Year:    Transportation Needs:     Film/video editor (Medical):     Lack of Transportation (Non-Medical):    Physical Activity:     Days of Exercise per Week:     Minutes of Exercise per Session:    Stress:     Feeling of Stress :    Social Connections:     Frequency of Communication with Friends and Family:     Frequency of Social Gatherings with  Friends and Family:     Attends Religious Services:     Active Member of Clubs or Organizations:     Attends Engineer, structural:     Marital Status:    Intimate Partner Violence:     Fear of Current or Ex-Partner:     Emotionally Abused:     Physically Abused:     Sexually Abused:        Review of Systems     Review of Systems  Negative: fever, shortness of breath, injury, rash, confusion; Positive: chest pain, abdominal pain, vomiting, SI     Physical Exam   Triage Vital Signs:  BP: (!) 153/92, Pulse - Palpated/Pleth: 117, Temp: 36.4 C (97.5 F), *Resp: (!) 24, SpO2: 99 %    Physical Exam   Constitutional: Anxious appearing   HEENT: NC/AT. PERRL, EOMI. Conjunctiva normal.  Neck: Normal range of motion. Neck supple.   Cardiovascular: Tachycardia, regular rhythm and normal heart sounds.    Pulmonary/Chest: Effort normal without accessory muscle usage. No respiratory distress.   Abdominal: Soft, non-distended. Epigastric tenderness with voluntary guarding. Mild diffuse abdominal tenderness. No rebound.   Musculoskeletal: No deformity. No lower extremity edema.  Neurological: A&O3, CN II-XII grossly intact. Moving all extremities. Mild tongue wag. No tremor.   Skin: Skin is warm and dry. No rash on exposed skin.   Psychiatric: Anxious, cooperative.        Initial Assessment (problem list and differential  diagnosis)     DDX: cholecystitis, pancreatitis, alcoholic gastritis; less likely cardiac etiology such as ACS given constant nature with exacerbation with PO intake, no evidence of mediastinitis, massive GI bleed, abdominal perf or ischemia given hx or exam. Tachycardia in setting of likely substance use versus withdrawal. Plan: EKG, CXR, labs with troponin and abdominal labs and BH screening labs, RUQ [place], symptom control w/ativan, IVF, pain medications, psych consult    Interpretations:  Lab, Imaging, and ECG       Utox + cocaine and amphetamine   Lipase 16   Troponin neg  LFTs wnl   RUQ negative for acute cholecystitis     Reassessment and Final Disposition     Mild improvement in symptom, continues to have abdominal tenderness and w/d after 6 ativan. Will plan for CT a/p and admit to medicine.     ED Course as of Mar 09 1525   [facility] Takai Chiaramonte's Documentation   Fri Mar 09, 2020   0720 Lactate, whole blood(!!): 6.0   0724 EKG sinus tachycardia, TWI in III without reciprocal changes.       0846 Lipase: 16   0846 Troponin I: <0.02   0846 XR CHEST 1 VIEW AP   03/09/2020 7:29 AM    HISTORY: chest pain    COMPARISON: None    IMPRESSION:   FINDINGS/IMPRESSION:    Clear lungs.    No pleural effusion or pneumothorax. Unremarkable cardiac and mediastinal contours.      2956 Patient on 5150.      0957 Hx, bipolar ETOH and meth use. May benefit from placement, psych meds ordered.       1256 Lactate downtrending    Lactate, whole blood: 1.4   1256 IMPRESSION:     1.  Normal gallbladder morphology, with no findings to suggest acute cholecystitis. Murphy's tenderness is not endorsed, however this may not be reliable in the setting of analgesia.    2.  Hypoechoic liver parenchyma with pronounced echogenicity of the periportal fat. This is  a nonspecific finding and although it can be seen in hepatitis is likely of no clinical significance given patient's normal liver panel.    Report dictated by: Donalee Citrin, MD,  signed by: Fernand Parkins, MD PhD  Department of Radiology and Biomedical Imaging      1256 ALT: 30   1256 AST: 28   1522 Sign out to medicine with CT abdomen pelvis pending.          Others' Documentation   Fri Mar 09, 2020   1610 Psych to place on 5150    [AM]   1509 Signout from Dr Elesa Hacker: stable 55 y.o. male pmh [facility], AUD, presents with etoh w/d and SI on 5150. A/t med for etoh w/d.  Follow up CT        [MB]      ED Course User Index  [AM] Carol Ada, MD  [MB] Adron Bene, MD         Ladell Heads, MD  Resident  03/09/20 1526       Carol Ada, MD  03/13/20 [phone]

## 2020-03-09 NOTE — Interdisciplinary (Addendum)
ED Social Work Note    Referral: 5150 DTS    Data:   54 y.o. man with previous diagnoses of bipolar disorder, alcohol and meth use seen in ED for psychiatric evaluation. EDSW following for psych placement.     Assessment:   Pt has [facility]. Medical clearance is pending.     Plan:   -EDSW following to assist with psych placement once med cleared.     Percell Locus, LCSW  ED social work [phone]  Voalte: [phone]    ADDENDUM: 4PM    Pt being admitted to Medicine.     Percell Locus, LCSW  ED social work [phone]  Voalte: [phone]

## 2020-03-09 NOTE — Nursing Note (Signed)
Pt transported to U/S via gurney by transport and accompanied by security.

## 2020-03-09 NOTE — H&P (Addendum)
HOSPITAL MEDICINE H&P NOTE     Family/Surrogate Contact Info  Unable to provide, per chart sister Barron Vanloan [phone]    Chief Complaint  Abdominal pain   Alcohol withdrawal  SI     History of Present Illness  54 y/o M with hx of EtOH, cocaine, and amphetamine use disorders (hx EtOH w/d without ICU or seizure) who presented with abdominal pain, chest pain, and SI placed on 5150 by pysch in the ED.     Reports some pinching chest pain this AM now resolved. Currently with abdominal pain, states chronic but unable to further describe character, chronicity, or other details. Per ED provider, had multiple episodes of emesis in ED with small amount of blood.     Variably reporting date of last EtOH and drug use to providers, as recently as the evening prior to admission (6/10 evening) to as remote as five years ago. Variable amounts of EtOH reported, from 6 beers per day to 24 beers plus pint of liquor. Told psych provider he was starting to feel EtOH withdrawal. Denies that on our interview.     Frequent PES visits for paranoia and audio-visual hallucinations and ED visits for chest pain/abdominal pain/vomting (last 01/2020 at [facility]). Recently moved back to [facility] from [place], [place].     In ED, afebrile, sinus tachycardia to 105, HTN to 150-170/90-110, 95-99% on room air. Labs notable for 7.49/30/23/lactate 6. Anion gap 17, Cr 1, Mg 1.7. Negative troponin, non-ischemic EKG. Normal LFTs. Normal INR. WBC nml, Hgb nml, plts 121. Lactate cleared with 1L IVF. Given Lorazepam 2mg  x 3 for c/f EtOH w/d, dilaudid 1mg  IV x 3 for abdominal pain, Risperidone 1mg  PO for psychosis, quetiapine 50mg  for anxiety, ondansetron 4mg  for nausea/vomiting prior to our interview. CT A/P negative for acute finding.     Medicine was called for admission since patient not appropriate for psych admission due to EtOH withdrawal.     Past Medical History:   Diagnosis Date    Alcoholism     Exposure to hepatitis C     Medial  epicondylitis of elbow     Osteoarthritis of wrist     Osteoarthrosis, unspecified whether generalized or localized, forearm     Osteoarthrosis, unspecified whether generalized or localized, lower leg     Vitamin D deficiency      No past surgical history on file.    Allergies: Patient has no known allergies.     Prior to Admission medications    Medication Instructions   ergocalciferol, vitamin D2, (VITAMIN D) 400 unit CAP Take 400 Units by mouth Twice a day.     lansoprazole (PREVACID) 30 mg capsule Take 1 capsule (30 mg total) by mouth Daily.   mirtazapine (REMERON) 15 mg tablet Take 1 tablet (15 mg total) by mouth nightly at bedtime.     Medications reconciled with patient/surrogate? Yes  Patient states he does NOT take any medications.   Records from [facility] show Amlodipine 5mg      Social History     Socioeconomic History    Marital status: Single     Spouse name: Not on file    Number of children: Not on file    Years of education: Not on file    Highest education level: Not on file   Tobacco Use    Smoking status: Never Smoker    Smokeless tobacco: Never Used   Substance and Sexual Activity    Alcohol use: Yes  Alcohol/week: 18.0 standard drinks     Types: 18 Cans of beer per week     Comment: 3 6-packs daily.     Drug use: Yes     Types: Cocaine, Methamphetamine     Comment: smokes (prior cocaine)      Social Determinants of Health     Financial Resource Strain:     Difficulty of Paying Living Expenses:    Food Insecurity:     Worried About Programme researcher, broadcasting/film/video in the Last Year:     Barista in the Last Year:    Transportation Needs:     Freight forwarder (Medical):     Lack of Transportation (Non-Medical):    Physical Activity:     Days of Exercise per Week:     Minutes of Exercise per Session:    Stress:     Feeling of Stress :    Social Connections:     Frequency of Communication with Friends and Family:     Frequency of Social Gatherings with Friends and Family:      Attends Religious Services:     Active Member of Clubs or Organizations:     Attends Engineer, structural:     Marital Status:    Intimate Partner Violence:     Fear of Current or Ex-Partner:     Emotionally Abused:     Physically Abused:     Sexually Abused:      Denies tobacco use   See HPI RE EtOH use   Denies recent drug use to me, endorses meth use in past week to other providers. UTox on admission positive for methamphetamine and cocaine. Prior UTOX positive for meth, cocaine, opiates, ecstasy.     Per review of recent [facility], originally from [place], has lived in [place] off and on, recently moved back to [place] from there. Was recently in treatment at [facility], trying to get back into [address]. 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     Family History was reviewed and is non-contributory to this illness.    ROS  Review of systems unobtainable due to altered mental status.    Vitals  Temp:  [36.1 C (96.9 F)-37.3 C (99.1 F)] 36.1 C (96.9 F)  Heart Rate:  [99-105] 99  BP: (111-174)/(89-116) 165/99  *Resp:  [14-24] 18  SpO2:  [93 %-99 %] 95 %    Physical Exam  GEN: Alert, sitting up in bed in NAD   HEENT: NCAT, MMM, no scleral icterus   RESP: Lungs clear bilaterally to auscultation over anterior fields   CV: Tachycardic, normal S1/S2, no LE edema  Abd; Declined exam  Ext: Warm, well-perused, no edema  Skin: Warm, dry, no reash   Neuro: Alert, no focal neuro deficits, mild tremor with outstretched hands. No asterixis.   Psych: Irritable, anxious     Data     138 103 9 / 83    4.1 18* 1.01 \     06/11 0700     6.8 \ 15.0 / 121*    / 45.1 \    06/11 0700     I have reviewed pertinent labs, microbiology, radiologystudies, EKGs, and telemetry as relevant. Pertinent results include:    -Afeb, HR 100-115/sinus, HTN 150-170/90-110, sat 95-99% on room air  -Labs: 7.49/30/23/lactate 6 -cleared after 1L IVF. Anion gap 17, Cr 1, Mg 1.7. Negative troponin,  non-ischemic EKG. Normal LFTs.  Normal INR. WBC nml, Hgb nml, plts 121.  -CT A/P neg acute  -UTOX pos cocaine, amphetamine     Problem-based Assessment & Plan  54 y/o M with hx of EtOH, cocaine, and amphetamine use disorders (hx EtOH w/d without ICU or seizure) who presents with SI and abdominal pain, placed on 5150 and admitted to medicine for EtOH withdrawal.     #AMS  #EtOH w/d  #Methamphetamine/cocaine intoxication   Patient difficult to engage in conversation but alert, mild tremor with no focal neuro deficits. Examined after Ativan/Risperidone/Quetiapine and UTOX pos amphetamine and cocaine, highest concern for medication effect, substance washout, and EtOH withdrawal. Minimal withdrawal sx on my exam but examined after Ativan. Difficult to assess risk of alcohol withdrawal given patient's widely varying alcohol use report. Other metabolic cause unlikely (normal lytes, BUN), no evidence of infection, no s/sx seizure or stroke, not hypoxic, v mild hypercarbia.   Dx:   - Low threshold for NCHCT   Tx:   - Ativan CIWA with gabapentin adjunct   - High dose thiamine pathway   - MVI daily   - Folate 1mg  pO daily   - Delirium precautions     #Suicidal ideation  #Psychosis i/s/o methamphetamine use   Multiple prior admissions for SI and placed on 5051s or 5250s (last 01/2020 at [facility]). Has prior SA, including nyquil overdose for which he was admitted at [facility] in 2015.   - Appreciate psych consult, placed on 5150  - START Risperidone 1 mg BID SCH  - Risperidone 0.5 mg BID PRN psychosis   - Quetiapine 50mg  Q6hours PRN agitation/anxiety  - Once medically stable (withdrawal controlled), refer for inpatient psych     #Hematemesis   Report of witnessed emesis with small volume blood in ED. C/f Mallory Weiss tear in setting of recent emesis. No e/o cirrhosis on imaging or labs so unlikely variceal bleeding.   - Pantoprazole 80mg  x1, 40mg  BID  - No current N/V, CIWA ativan may be serving as anti emetic. IF recurrent emesis,  start additional agents.   - PIVs  - QAM CBC     #Abdominal pain, acute on chronic   Chronic pain per patient. Diffuse but worse in epigastrium. Told other providers worse with eating. Ddx peptic ulcer, alcohol gastritis, chronic pancreatitis (lipase normal). Unlikely acute pancreatitis due to normal lipase. EKG/trop non-ischemic so unlikely ACS. Lactate normalized after fluids, likely elevated due to EtOH and unlikely mesenteric ischemia despite cocaine use. CT A/P neg acute.  Dx:   - Follow up final read of CT   - Serial exams when patient amenable   Tx:   - APAP PRN  - Pantoprazole as above   - Bowel regimen     #Thrombocytopenia   Most likely due to BM suppression from EtOH. No e/o acute infection.   Dx:   - HIV test    - CBC QAM  Tx:   - EtOH cessation discussion     H&P Inpatient Checklist   Code status: Full; confirmed: No, 5150   Level of care: TCU (CIWA)   Vital Signs: Q4H   Monitoring: Telemetry and CPO   Diet: Regular   DVT prophylaxis: none, Padua < 4   Foley: No Foley   Delirium bundle: ordered    COVID-19 vaccine status: will assess on discharge   Dispo: Psych when medically cleared; PT/OT: Not indicated    2016, MD
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
## 2020-03-09 NOTE — H&P (Addendum)
HOSPITAL MEDICINE HISTORY AND PHYSICAL     Contact: Unable to provide; per chart Owain Eckerman (Sister) [phone]    PCP: Unknown    Chief Complaint: Shortness of breath, anxiety; abdominal pain; suicidal ideation     HPI:    54 y.o. male with history of EtOH use disorder w/ prior withdrawal, bipolar disorder, and anxiety who presents with anxiety, suicidal ideation, and chest pain.     Pt reports 3 days of shortness of breath and anxiety. He attributes the shortness of breath to recent life traumas and says it feels similar to stressful episodes in the past. His shortness of breath has been progressive to the point where he couldn't catch breath while sitting down this morning.     At 1am, he vomited three times with minimal blood. This morning, he had sharp 7/10 chest pain that felt like "pinches" and increased when he ate food. He also endorses sharp 6/10 upper belly pain that is a chronic, intermittent problem for him over the past several years.     He typically drinks a 6 pack of beer/day, with his last drink at 8am this morning (despite arriving at ED at 7am and different from hx given to ED of last drink last night). He endorses prior withdrawal symptoms where "he couldn't take care of himself". He denies alcohol withdrawal seizures or a need for intubation. Pt was very somnolent during the interview and unable to elaborate more. Per chart history, he has gotten shakes when he stops drinking.    Overall, history limited by pt somnolence. Substance use history appears inconsistent across providers: he initially denied recreational drug use, but then reported last meth use at the age of 62-17. In contrast, he endorsed last meth use 4 days ago to psychiatry and last meth/fentanyl use 3 months ago to ED.     Per ED records, he was brought in by ambulance for a few days of upper abdominal pain that radiated to his left chest. The patient also endorsed suicidal ideation with a plan to jump out of a car.      ED Vital Signs:  T 37.3C, HR 105, RR 20, BP 159/90, 97% on RA.     ED Labs/Workup: Lactate of 3.7 (downtrended w/ 3L fluids), lipase of 16, troponin neg, LFTs wnl, EKG w/ sinus tachycardia, Utox + for cocaine and amphetamine, and RUQ ultrasound neg for acute cholecystitis.     ED Tx: He was given 6mg  ativan, dilaudid, and toradol. He was seen by psychiatry and placed on a 5150-DTS, and then admitted to medicine for management of substance withdrawal.     Review of Systems  CONSTITUTIONAL - no fevers/chills  CV - +chest pain , no orthopnea or LE edema  RESP - +shortness of breath, no cough  ABDOMINAL - +abdominal pain, +nausea/vomiting, no diarrhea, +constipation, no BRBPR   GU - no dysuria or hematuria  MSK - no myalgias, +R knee pain  NEURO - no weakness, dizziness, headache  PSYCH - +anxious mood    All other systems were reviewed and negative     Past Medical History:   Diagnosis Date    Alcoholism     Exposure to hepatitis C     Medial epicondylitis of elbow     Osteoarthritis of wrist     Osteoarthrosis, unspecified whether generalized or localized, forearm     Osteoarthrosis, unspecified whether generalized or localized, lower leg     Vitamin D deficiency    History of  cardiac issue in 1998 (possible arrhythmia?), osteoarthritis, bilateral carpal tunnel syndrome, chronic low back pain.     No past surgical history on file.    No current facility-administered medications on file prior to encounter.     Current Outpatient Medications on File Prior to Encounter   Medication Sig Dispense Refill    ergocalciferol, vitamin D2, (VITAMIN D) 400 unit CAP Take 400 Units by mouth Twice a day.        lansoprazole (PREVACID) 30 mg capsule Take 1 capsule (30 mg total) by mouth Daily.      mirtazapine (REMERON) 15 mg tablet Take 1 tablet (15 mg total) by mouth nightly at bedtime.     Pt reports that he does not take any medications at home.     Allergies/Contraindications  No Known Allergies    Family History:    Deferred, pt unable to provide family history due to somnolence     Social History: works in Office manager   Social History     Socioeconomic History    Marital status: Single     Spouse name: Not on file    Number of children: Not on file    Years of education: Not on file    Highest education level: Not on file   Occupational History    Not on file   Tobacco Use    Smoking status: Never Smoker    Smokeless tobacco: Never Used   Substance and Sexual Activity    Alcohol use: Yes     Alcohol/week: 18.0 standard drinks     Types: 18 Cans of beer per week     Comment: 3 6-packs daily.     Drug use: Yes     Types: Cocaine, Methamphetamine     Comment: smokes (prior cocaine)     Sexual activity: Not on file   Other Topics Concern    Not on file   Social History Narrative    Not on file     Social Determinants of Health     Financial Resource Strain:     Difficulty of Paying Living Expenses:    Food Insecurity:     Worried About Programme researcher, broadcasting/film/video in the Last Year:     Barista in the Last Year:    Transportation Needs:     Freight forwarder (Medical):     Lack of Transportation (Non-Medical):    Physical Activity:     Days of Exercise per Week:     Minutes of Exercise per Session:    Stress:     Feeling of Stress :    Social Connections:     Frequency of Communication with Friends and Family:     Frequency of Social Gatherings with Friends and Family:     Attends Religious Services:     Active Member of Clubs or Organizations:     Attends Engineer, structural:     Marital Status:    Intimate Partner Violence:     Fear of Current or Ex-Partner:     Emotionally Abused:     Physically Abused:     Sexually Abused:          PHYSICAL EXAM AND LABS     Physical Exam   BP (!) 165/99 (BP Location: Right upper arm, Patient Position: Lying)   Pulse 99   Temp 36.1 C (96.9 F) (Oral)   Resp 18   SpO2 95%   GEN - sleepy  w/ mumbling speech, no acute distress   HEENT - NCAT, MMM,  +conjunctival injection  RESP - Clear to auscultation bilaterally in anterior lung fields   CV - Tachycardic   ABD - Soft, tender to palpation in all 4 quadrants with voluntary guarding, especially in epigastric region.   EXT - warm, well perfused   SKIN - Warm, dry, no rash   NEURO - somnolent but arouses to voice, CN grossly intact, moving all extremities spontaneously. No tremor.   PSYCH - Mood and affect anxious     Labs/Data  Recent Labs     03/09/20  0700   WBC 6.8   HGB 15.0   HCT 45.1   PLT 121*   NA 138   K 4.1   CL 103   CO2 18*   BUN 9   CREAT 1.01   GLU 83   CA 9.4   MG 1.7   PT 13.7   INR 1.1   AST 28   ALT 30   ALKP 62   TBILI 0.5   ALB 4.2       Microbiology Results (last 72 hours)     ** No results found for the last 72 hours. **        Radiology Results (last week)     Procedure Component Value Units Date/Time    CT Abdomen /Pelvis with Contrast [[id number]] Collected: 03/09/20 1655    Order Status: Completed Updated: 03/09/20 1710    Narrative:      CT ABDOMEN/PELVIS WITH CONTRAST  03/09/2020 4:54 PM    CLINICAL HISTORY: epigastric abdominal pain    COMPARISON:  Ultrasound abdomen from 03/09/2020    TECHNIQUE: Following the administration of 150 cc of Omnipaque 350, contiguous 1.25-mm collimation axial images were obtained through the abdomen and pelvis. Coronal and sagittal reformats were also obtained.    CONTRAST MEDIA: Omnipaque    FINDINGS:    Visualized lung bases:  Bibasilar atelectasis    Liver:  Unremarkable    Gallbladder: Unremarkable.    Spleen:  Unremarkable    Pancreas:  Unremarkable     Adrenal Glands:  Unremarkable    Kidneys:  Hypodense renal mass(es) too small to characterize, either benign or clinically insignificant.    GI Tract:  Unremarkable    Vasculature:  Unremarkable    Lymphadenopathy: Absent    Peritoneum: No ascites    Bladder: Unremarkable    Reproductive organs: Unremarkable    Bones:  No suspicious lesions    Extraperitoneal soft tissues: Small fat containing umbilical  hernia.    Lines/drains/medical devices: None    RADIATION DOSE INDICATORS:  Exposure Events: 3 , CTDIvol Max: 18.3 mGy, DLP: 1011.8 mGy.cm      Impression:        Unremarkable CT of the abdomen and pelvis; no etiology for epigastric pain identified.    Report dictated by: Lyndel Safe, MD, signed by: Raleigh Nation, MD  Department of Radiology and Biomedical Imaging    US Abdomen Limited (RUQ) [[id number]] Collected: 03/09/20 1044    Order Status: Completed Updated: 03/09/20 1218    Narrative:      US ABDOMEN LIMITED:  03/09/2020 10:40 AM    INDICATION:    eval cholecysitis    COMPARISON: None.    TECHNIQUE: An abdominal sonogram was performed with real-time sonography using 4-9 mHz transducers.     FINDINGS:     Pancreas: Normal where visualized.    Aorta: Nonaneurysmal where seen.  Inferior  vena cava: Poorly visualized, but appears patent with phasic flow     Liver: Span measures 17.6 cm. Diffusely hypoechoic liver parenchyma, with echogenic periportal fat. No focal hepatic lesion. No contour nodularity.  Main portal vein: 0.9 cm Patent with normal direction of flow.  Bile ducts: Common bile duct measures 0.5 cm No bile duct dilatation.  Gallbladder: Mildly distended gallbladder neck, although folded, with otherwise normal appearance, with no wall thickening or pericholecystic fluid. No evidence of stones and no definite sludge. Nontender, though patient is on pain medication.     Right kidney measures 13.4 cm No hydronephrosis or shadowing calculi.    Spleen: Measures 10.1 cm.      Impression:        1.  Normal gallbladder morphology, with no findings to suggest acute cholecystitis. Murphy's tenderness is not endorsed, however this may not be reliable in the setting of analgesia.    2.  Hypoechoic liver parenchyma with pronounced echogenicity of the periportal fat. This is a nonspecific finding and although it can be seen in hepatitis is likely of no clinical significance given patient's normal liver  panel.    Report dictated by: Donalee Citrin, MD, signed by: Fernand Parkins, MD PhD  Department of Radiology and Biomedical Imaging    XR Chest 1 View AP [[id number]] Collected: 03/09/20 0746    Order Status: Completed Updated: 03/09/20 0840    Narrative:      XR CHEST 1 VIEW AP   03/09/2020 7:29 AM    HISTORY: chest pain    COMPARISON: None      Impression:      FINDINGS/IMPRESSION:    Clear lungs.    No pleural effusion or pneumothorax. Unremarkable cardiac and mediastinal contours.    Report dictated by: Mikeal Hawthorne, MD, PhD, signed by: Lolita Patella, MD  Department of Radiology and Biomedical Imaging          ASSESSMENT AND PLAN     54 y.o. male with history of EtOH use disorder w/ prior withdrawal, polysubstance use, and bipolar disorder who presented with acute-on-chronic abdominal pain w/ hematemesis, anxiety, suicidal ideation, and chest pain and is admitted on a 5150-DTS for management of substance withdrawal.    #Substance use disorder  #Withdrawal   Pt has history of admissions for alcohol intoxication and meth use, but has given an inconsistent history of substance intake on this admission. Drinks 6 beers/day, last drink this morning or last night. Utox positive for amphetamines and cocaine. Vitals notable for tachycardia and hypertension; conjunctival injection on exam w/o any tremor. Unclear if this is due to alcohol withdrawal or other substance withdrawal/intoxication as exam and history were limited by patient somnolence and confusion, likely 2/2 benzodiazepine and opioids. Per ED and psych notes, pt was dysphoric and tearful w/ paranoia, decreased need for sleep, and tongue wag, which is markedly different from our encounter later in the day.   - Reassess pt later in evening w/ low threshold for NCHCT / neurological workup if pt continues to have AMS.   - CIWA monitoring w/ prn Ativan   - Thiamine 500mg  IV x 3d, 250mg  IV x4d, and then 100mg  PO daily  - Folate 1mg  PO, multivitamin daily     #Hematemesis    3x emesis w/ trace blood prior to coming to the ED. Tachycardic but hypertensive on admission, has not vomited since arrival. 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 Mallory-Weiss tear possible with recent vomiting and past hx of  probable mallory-weiss tear in 2015. Peptic ulcer disease also on the differential as a cause of chest/abdominal pain that worsens with food.    - Pantoprazole IV BID   - Pt on Ativan for CIWA, which also serves as anti-emetic     #Epigastric pain   Pt endorses sharp epigastric pain that radiates to the back that has been intermittent over years. Lipase, LFTs, and trop nl. Neg RUQ U/S and CT AP, EKG w/ sinus tachycardia. Nl lactate after fluids. [facility] outside [facility] [place], pt has history of alcoholic gastritis that may have been exacerbated by recent heavy alcohol use. Chronic pancreatitis is possible given history of heavy alcohol use and normal lipase. Mesenteric ischemia is on the differential given limited abdominal exam and cocaine use, but CT AP neg.   - APAP 650mg  q8h prn pain   - Hydromorphone 1mg  IV q2h prn pain     #Chest pain   Chest pain w/ "pinching" quality, worse with food. ACS or other cardiac etiology less likely with nl trop, CXR and EKG w/ sinus tach. Pulm etiology also less likely given normal respiratory rate and O2 saturation on RA since arrival. Differential includes GERD or reflux esophagitis 2/2 alcohol use, alcoholic gastritis, mallory weiss tear iso recent vomiting.   - CTM   - Pain mgmt as above     #Shortness of breath   #Anxiety   Endorses anxiety and shortness of breath due to life stressors that are suggestive of panic attacks. CXR and trop neg, EKG w/ sinus tach. Nl RR and O2 sat on RA since admission. Hx of ED visits for anxiety and auditory hallucinations that were diagnosed as schizophrenia/ schizophreniform disorder at the time.   - Quetiapine 50mg  q6h prn anxiety, agitation     #Suicidal  Ideation   #Bipolar disorder  Pt placed on 5150-DTS (expires 6/14, 6:19am) after endorsing suicidal ideation on arrival. - Psych following, appreciate recs  - Risperidone 1mg  BID  - Risperidone 0.5mg  BID prn psychosis     CHRONIC  #R knee osteoarthritis   #B/l carpal tunnel syndrome   #Chronic low back pain   Chronic issues per pt   - Pain mgmt as above     #Inpatient bundle   - Diet: Regular  - DVT ppx: Padua 0, no DVT ppx indicated   - GI ppx: 40mg  pantoprazole IV BID   - Bowel regimen: senna PRN and miralax PRN  - PT/OT: none  - Access: PIVs  - Telemetry: NA  - CPO: NA  - Code status: FULL confirmed  - Dispo: pending    Pierre BaliAnne Ashleymarie Granderson, MS4  03/09/2020

## 2020-03-10 DIAGNOSIS — F1099 Alcohol use, unspecified with unspecified alcohol-induced disorder: Secondary | ICD-10-CM

## 2020-03-10 DIAGNOSIS — R109 Unspecified abdominal pain: Secondary | ICD-10-CM

## 2020-03-10 DIAGNOSIS — R4182 Altered mental status, unspecified: Secondary | ICD-10-CM

## 2020-03-10 DIAGNOSIS — D696 Thrombocytopenia, unspecified: Secondary | ICD-10-CM

## 2020-03-10 DIAGNOSIS — F1590 Other stimulant use, unspecified, uncomplicated: Secondary | ICD-10-CM

## 2020-03-10 LAB — COMPREHENSIVE METABOLIC PANEL
AST: 22 U/L (ref 5–44)
Alanine transaminase: 24 U/L (ref 10–61)
Albumin, Serum / Plasma: 3.7 g/dL (ref 3.5–5.0)
Alkaline Phosphatase: 56 U/L (ref 38–108)
Anion Gap: 8 (ref 4–14)
Bilirubin, Total: 0.7 mg/dL (ref 0.2–1.2)
Calcium, total, Serum / Plasma: 9 mg/dL (ref 8.4–10.5)
Carbon Dioxide, Total: 26 mmol/L (ref 22–29)
Chloride, Serum / Plasma: 105 mmol/L (ref 101–110)
Creatinine: 0.99 mg/dL (ref 0.73–1.24)
Glucose, non-fasting: 104 mg/dL (ref 70–199)
Potassium, Serum / Plasma: 3.9 mmol/L (ref 3.5–5.0)
Protein, Total, Serum / Plasma: 6.9 g/dL (ref 6.3–8.6)
Sodium, Serum / Plasma: 139 mmol/L (ref 135–145)
Urea Nitrogen, Serum / Plasma: 9 mg/dL (ref 7–25)
eGFR - high estimate: 100 mL/min (ref >59)
eGFR - low estimate: 86 mL/min (ref >59)

## 2020-03-10 LAB — COMPLETE BLOOD COUNT WITH DIFF
Abs Basophils: 0.01 10*9/L (ref 0.00–0.10)
Abs Eosinophils: 0.09 10*9/L (ref 0.00–0.40)
Abs Imm Granulocytes: 0.01 10*9/L (ref <0.10)
Abs Lymphocytes: 0.9 10*9/L — ABNORMAL LOW (ref 1.00–3.40)
Abs Monocytes: 0.99 10*9/L — ABNORMAL HIGH (ref 0.20–0.80)
Abs Neutrophils: 2.87 10*9/L (ref 1.80–6.80)
Hematocrit: 43.9 % (ref 41.0–53.0)
Hemoglobin: 14.5 g/dL (ref 13.6–17.5)
MCH: 31.9 pg (ref 26.0–34.0)
MCHC: 33 g/dL (ref 31.0–36.0)
MCV: 97 fL (ref 80–100)
Platelet Count: 101 10*9/L — ABNORMAL LOW (ref 140–450)
RBC Count: 4.55 10*12/L (ref 4.40–5.90)
WBC Count: 4.9 10*9/L (ref 3.4–10.0)

## 2020-03-10 LAB — MAGNESIUM, SERUM / PLASMA: Magnesium, Serum / Plasma: 2.1 mg/dL (ref 1.6–2.6)

## 2020-03-10 LAB — ETHANOL, SERUM OR PLASMA: Ethanol, serum or plasma: 0.036 g/dL — ABNORMAL HIGH (ref <0.010)

## 2020-03-10 MED ORDER — CHLORDIAZEPOXIDE 25 MG CAPSULE: 25 mg | ORAL | 0 refills | Status: AC

## 2020-03-10 MED ORDER — KETOROLAC 15 MG/ML INJECTION SOLUTION
15 mg/mL | INTRAMUSCULAR | Status: AC | PRN
  Administered 2020-03-10: 16:00:00 15 mg via INTRAVENOUS

## 2020-03-10 MED ORDER — ELECTROLYTE-A IV BOLUS
INTRAVENOUS | Status: DC
  Administered 2020-03-10: 16:00:00 500 mL via INTRAVENOUS
  Administered 2020-03-11: 05:00:00

## 2020-03-10 MED FILL — LORAZEPAM 2 MG/ML INJECTION SOLUTION: 2 mg/mL | INTRAMUSCULAR | Qty: 1

## 2020-03-10 MED FILL — THIAMINE HCL (VITAMIN B1) 100 MG/ML INJECTION SOLUTION: 100 mg/mL | INTRAMUSCULAR | Qty: 5

## 2020-03-10 MED FILL — ACETAMINOPHEN 325 MG TABLET: 325 mg | ORAL | Qty: 2

## 2020-03-10 MED FILL — PANTOPRAZOLE 40 MG INTRAVENOUS SOLUTION: 4 mg/mL | INTRAVENOUS | Qty: 10

## 2020-03-10 MED FILL — FOLIC ACID 1 MG TABLET: 1 mg | ORAL | Qty: 1

## 2020-03-10 MED FILL — SENNA LAX 8.6 MG TABLET: 8.6 mg | ORAL | Qty: 2

## 2020-03-10 MED FILL — LORAZEPAM 1 MG TABLET: 1 mg | ORAL | Qty: 1

## 2020-03-10 MED FILL — RISPERIDONE 1 MG TABLET: 1 mg | ORAL | Qty: 1

## 2020-03-10 MED FILL — GABAPENTIN 300 MG CAPSULE: 300 mg | ORAL | Qty: 1

## 2020-03-10 MED FILL — GABAPENTIN 300 MG CAPSULE: 300 mg | ORAL | Qty: 2

## 2020-03-10 MED FILL — TAB-A-VITE 400 MCG TABLET: 400 mcg | ORAL | Qty: 1

## 2020-03-10 MED FILL — KETOROLAC 15 MG/ML INJECTION SOLUTION: 15 mg/mL | INTRAMUSCULAR | Qty: 1

## 2020-03-10 NOTE — Discharge Instructions (Addendum)
Dear Tyler Bailey,    You were admitted for alcohol withdrawal. To prevent further withdrawal, please take librium 1 tab twice a day for one day, then 1 tab a day for one day.    RETURN INSTRUCTIONS:  - Please contact a healthcare provider or return to the emergency room for: shakes, confusion, chest pain, bleeding  - If you have questions, please contact your primary care provider or hospital team (phone numbers above).    It was a pleasure taking care of you.

## 2020-03-10 NOTE — Progress Notes (Signed)
PSYCHIATRY CONSULT FOLLOW-UP       Identifying Details:   Tyler Bailey is a 54 y.o. man with previous diagnoses of bipolar disorder, alcohol and meth use who is referred by Dr. Leda Gauze of the emergency medicine service for evaluation of danger to self and paranoia       Interval History / Subjective  Paged by medicine requesting re-eval with increased mental clarity. Patient was arousable to voice. Reports "I'm doing great". Feels the SI was all a mistake over concerns that his pets had been taken from him but now knows that his sister and cousin have them. Reports he relapsed day before yesterday after 6 years of sobriety. Discussed multiple ED visits over the past several months, however, patient reports these were for his anxiety attacks. Denies SI now. Denying HI/AVH. Reports he wants to go back to his 12 step program at 9 AM tomorrow. Agreed to allow physician to call his sister however then stated he was unable to recall her phone number and states he lost his phone. Declining further services.       The patient has not taken any scheduled medications because they do not have any scheduled medications ordered at present.    The patient has not had any PRN medications.      Current Medications  Scheduled Meds:   0.9% sodium chloride flush  3 mL Intravenous Q8H SCH    folic acid  1 mg Oral Daily SCH    gabapentin  600 mg Oral Q8H SCH    multivitamin  1 tablet Oral Daily SCH    pantoprazole  40 mg Intravenous BID SCH    risperiDONE  1 mg Oral BID SCH    senna  17.2 mg Oral Daily At Bedtime [facility]    [START ON 03/17/2020] thiamine mononitrate (vit B1)  100 mg Oral Daily SCH     Continuous Infusions:   electrolyte-A bolus Stopped (03/10/20 1106)    thiamine (VITAMIN B1) IVPB Stopped (03/10/20 0912)    Followed by    Melene Muller ON 03/12/2020] thiamine (VITAMIN B1) IVPB       PRN Meds:   0.9% sodium chloride flush  3 mL Intravenous PRN    acetaminophen  650 mg Oral Q8H PRN    LORazepam  0-4 mg Oral Q2H PRN     Or    LORazepam  0-4 mg Intravenous Q2H PRN    LORazepam  0-4 mg Oral Once PRN    Or    LORazepam  0-4 mg Intravenous Once PRN    polyethylene glycol  17 g Oral Daily PRN    QUEtiapine  50 mg Oral Q6H PRN    risperiDONE  0.5 mg Oral BID PRN         Vitals  BP (!) 158/90 (BP Location: Right upper arm, Patient Position: Lying)   Pulse 98   Temp 36.3 C (97.4 F) (Oral)   Resp 18   SpO2 99%     Mental Status Exam   General Appearance: Fairly groomed..  Attitude/Behavior: Cooperative, conversant, engaged, and good eye contact..  Motor: No psychomotor agitation or retardation, no tremor or other abnormal movements.  Speech: Normal rate, volume, and prosody..  Mood: "i'm doing great".  Affect: Euthymic. Full range..  Thought Process: Linear. Goal directed..  Thought Associations: No loosening of associations..  Thought Content: Patient reports they have no suicidal ideation nor homicidal ideation. No delusions expressed..  Perception: No perceptual abnormalities noted..  Insight: Fair, as evidenced  by his ability to verbalize an understanding.  Judgment: Fair, as evidenced by help-seeking behavior and not following treatment recommendations.  Impulse Control: Limited.  Musculoskeletal/Gait: No abnormal movements noted. Normal, steady gait.            DATA  Most recent relevant labs from past 24 hours   Component Value Date/Time    WBC 4.9 03/10/2020 0635    HGB 14.5 03/10/2020 0635    HCT 43.9 03/10/2020 0635    PLT 101 (L) 03/10/2020 0635    NA 139 03/10/2020 0635    K 3.9 03/10/2020 0635    CL 105 03/10/2020 0635    CO2 26 03/10/2020 0635    BUN 9 03/10/2020 0635    CREAT 0.99 03/10/2020 0635    GLU 104 03/10/2020 0635    CA 9.0 03/10/2020 0635    MG 2.1 03/10/2020 0635    AST 22 03/10/2020 0635    ALT 24 03/10/2020 0635    ALKP 56 03/10/2020 0635    TBILI 0.7 03/10/2020 0635    TP 6.9 03/10/2020 0635    ALB 3.7 03/10/2020 0635     CT Abdomen Darrold Junker with Contrast    Result Date: 03/09/2020  Unremarkable CT  of the abdomen and pelvis; no etiology for epigastric pain identified. Report dictated by: Lyndel Safe, MD, signed by: Raleigh Nation, MD Department of Radiology and Biomedical Imaging     XR Chest 1 View AP    Result Date: 03/09/2020  FINDINGS/IMPRESSION: Clear lungs. No pleural effusion or pneumothorax. Unremarkable cardiac and mediastinal contours. Report dictated by: Mikeal Hawthorne, MD, PhD, signed by: Lolita Patella, MD Department of Radiology and Biomedical Imaging     US Abdomen Limited (RUQ)    Result Date: 03/09/2020  1.  Normal gallbladder morphology, with no findings to suggest acute cholecystitis. Murphy's tenderness is not endorsed, however this may not be reliable in the setting of analgesia. 2.  Hypoechoic liver parenchyma with pronounced echogenicity of the periportal fat. This is a nonspecific finding and although it can be seen in hepatitis is likely of no clinical significance given patient's normal liver panel. Report dictated by: Donalee Citrin, MD, signed by: Fernand Parkins, MD PhD Department of Radiology and Biomedical Imaging       Overall Assessment (1-3 sentences)  Tyler Bailey is a 54 y.o. man with previous diagnoses of bipolar disorder, alcohol and meth use who is referred by Dr. Leda Gauze of the emergency medicine service for evaluation of danger to self and paranoia       Interval Assessment  Patient appearing brighter, able to complete interview. Declining SI and is future orientation to follow up outpatient with a clinic. Minimizing alcohol use as evidenced by multiple recorded ED visits for alcohol use, however, reports that he has a 12 step program he intends to attend in the morning.     Problem Based  Recommendations:    #unspecified mood disorder  #unspecified psychosis  - d/c risperidone  - has outpatient clinic to follow up with  - if possible, have SW provide all appropriate resources for outpatient psychiatry (also requesting bus pas)    #alcohol use disorder, severe   - defer to medicine for EtOH withdrawal  - reports has a 12 step program to attend  - declines further resources     #legal  - d/c 5150  - discharge per medicine      Legal/Holds (From admission, onward)    None  Behavioral Health Precautions (From admission, onward)             Fall precautions  Continuous                         TIME BASED SERVICES    Psychotherapy  Number of Minutes Spent Performing/Live Supervising Psychotherapy: N/A  Psychotherapy Modality: N/A  -Or-  Evaluation & Management  Number of Minutes Spent Conducting and/or Live Supervising Evaluation & Management (E&M) (including time spent both face to face with the patient and time spent on the unit on this patient's care): N/A - E&M coding by complexity of care.  Portion Spent on Counseling & Coordination of Care: N/A - E&M coding by complexity of care.  Topics (in addition to those noted above): N/A - E&M coding by complexity of care.    Prolonged Service: N/A    Gwenyth Allegra, MD  03/10/20

## 2020-03-10 NOTE — Progress Notes (Cosign Needed Addendum)
HOSPITAL MEDICINE PROGRESS NOTE     24 Hour Course/Overnight Events  - NAEO   - CIWA 7-15 overnight, received 2/2/1/1 mg lorazepam. Primarily scoring for sweating, nausea, tremor.     Subjective/Review of Systems  "Not feeling good at all." Slept poorly, 6/10 sore chest pain that radiates to L shoulder. Vomited yesterday afternoon. 10/10 headache primarily in the front of his head along with blurry vision. Headache became a "smooth 5/10" with IV toradol.     1:30pm update: Pt asking to d/c today, says he feels well and denies SI/HI. Per ED SW, he reports he is connected with services for substance use, was abstinent for 4 months, and had a relapse. On assessment, is awake, alert, speaking clearly, sitting up in bed and eating food. Has ambulated without difficulty.     Vitals  Temp:  [36.1 C (96.9 F)-36.8 C (98.2 F)] 36.5 C (97.7 F)  Heart Rate:  [85-102] 97  BP: (111-176)/(78-116) 139/98  *Resp:  [14-20] 16  SpO2:  [93 %-99 %] 98 %    Intake/Output Summary (Last 24 hours) at 03/10/2020 1114  Last data filed at 03/10/2020 1106  Gross per 24 hour   Intake 850 ml   Output 1720 ml   Net -870 ml     Physical Exam  GEN - sleepy w/ mumbling speech, appears uncomfortable  HEENT - NCAT, MMM, EOMI, no nystagmus.   RESP - Clear to auscultation bilaterally in anterior lung fields   CV - Tachycardic   ABD - Soft, tender to palpation in RLQ & LUQ with voluntary guarding   MSK - Chest markedly tender with light palpation.   EXT - warm, well perfused   SKIN - Warm, dry, no rash   NEURO - somnolent but arouses to voice, CN grossly intact, moving all extremities spontaneously. 5/5 strength in lower extremities bilaterally. +tremor   PSYCH - Mood and affect anxious     Data  I have reviewed pertinent labs, microbiology, radiology studies, EKGs, and telemetry as relevant. Pertinent results include:     CBC        03/10/20  0635 03/09/20  0700   WBC 4.9 6.8   HGB 14.5 15.0   HCT 43.9 45.1   PLT 101* 121*     Coags        03/09/20   0700   INR 1.1     Chem7        03/10/20  0635 03/09/20  0700   NA 139 138   K 3.9 4.1   CL 105 103   CO2 26 18*   BUN 9 9   CREAT 0.99 1.01   GLU 104 83     Electrolytes        03/10/20  0635 03/09/20  0700   CA 9.0 9.4   MG 2.1 1.7     Liver Panel        03/10/20  0635 03/09/20  0700   AST 22 28   ALT 24 30   ALKP 56 62   TBILI 0.7 0.5   TP 6.9 7.5   ALB 3.7 4.2     Amylase/Lipase        03/09/20  0700   LIPA 16       Imaging   CT Abdomen /Pelvis with Contrast    Result Date: 03/09/2020  Unremarkable CT of the abdomen and pelvis; no etiology for epigastric pain identified. Report dictated by: Lyndel Safe, MD, signed by: Windy Fast  Manfred Arch, MD Department of Radiology and Biomedical Imaging    Pending tests:  Pending Labs     Order Current Status    HIV Ag/Ab Combo In process            Problem-based Assessment and Plan  54 y.o. male with history of EtOH use disorder w/ prior withdrawal, polysubstance use, alcoholic gastritis and pancreatitis, and schizophreniform disorder who presented with acute-on-chronic abdominal and chest pain w/ hematemesis, anxiety, suicidal ideation (on 5150-DTS) and is admitted for management of EtOH withdrawal, now with resolving withdrawal symptoms.    #Substance use disorder  #EtOH Withdrawal   CIWAs 7-15 overnight w/ points for sweating, nausea, and tremor, decreased to CIWA 2-3 this afternoon. Pt has history of admissions for alcohol intoxication and meth use, but has given an variable history of substance intake on this admission. Drinks 6 beers/day, last drink 6/11. Tachycardic and hypertensive on admission; utox positive for amphetamines and cocaine & ethyl alcohol of 0.036. Likely primarily 2/2 alcohol withdrawal with some component of cocaine/amphetamine washout given pos utox. Pt reported to be anxious, dysphoric, and tearful on arrival; subsequent somnolence on our initial exam likely 2/2 benzodiazepine and opioids.   - CIWA monitoring q2h w/ prn Ativan   - Thiamine  500mg  IV x 3d (6/11-6/13), 250mg  IV x4d (6/14-6/17), and then 100mg  PO daily (6/18- )   - Folate 1mg  PO, multivitamin daily     #Hematemesis   #Acute-on-chronic epigastric pain   Tolerating meals w/o significant nausea/emesis. 3x emesis w/ trace blood prior to coming to the ED and sharp epigastric pain that radiates to the back. Tachycardic but hypertensive on admission, s/p 3.5L IVF. Lipase, LFTs, and trop nl. Neg RUQ U/S and CT AP, EKG w/ sinus tachycardia. Nl lactate after fluids.Likely exacerbation of alcoholic gastritis vs chronic pancreatitis iso increased alcohol intake. Mallory-Weiss tear possible with recent vomiting and past hx of probable Mallory-Weiss tear in 2015.   - Pantoprazole IV BID   - APAP 650mg  q8h prn pain     #Chest pain   Chest pain w/ "pinching" quality, worse with food. Tender to palpation ACS or other cardiac etiology less likely with nl trop, CXR and EKG w/ sinus tach. Pulm etiology also less likely given normal respiratory rate and O2 saturation on RA since arrival. Differential includes GERD or reflux esophagitis 2/2 alcohol use, alcoholic gastritis, mallory weiss tear iso recent vomiting.   - CTM   - Pain mgmt as above     #Anxiety, dyspnea   #Suicidal Ideation   #Hx bipolar, schizophreniform disorder  Currently denies SI, although per psych was endorsing SI as recently as this morning. Reports SI is associated with alcohol use. Endorses anxiety and shortness of breath due to life stressors that are suggestive of panic attacks. CXR and trop neg, EKG w/ sinus tach. Nl RR and O2 sat on RA since admission. Pt has hx of ED visits for anxiety and auditory hallucinations that were diagnosed as schizophrenia / schizophreniform disorder at the time. On this admission, he endorsed SI (desire to jump out of a moving car) and was placed on 5150-DTS (expires 6/14, 6:19am).   - Psych following, appreciate recs  - Risperidone 1mg  BID  - Risperidone 0.5mg  BID prn psychosis   - Quetiapine 50mg  q6h  prn anxiety, agitation     CHRONIC  #R knee osteoarthritis   #B/l carpal tunnel syndrome   #Chronic low back pain   Chronic issues per pt   - Pain  mgmt as above     #Inpatient bundle   - Diet: Regular  - DVT ppx: Padua 0, no DVT ppx indicated   - GI ppx: 40mg  pantoprazole IV BID   - Bowel regimen: senna PRN and miralax PRN  - PT/OT: none  - Access: PIVs  - Telemetry: NA  - CPO: NA  - Code status: FULL confirmed  - Dispo: pending psych assessment (on 5150-DTS)    Gae Dry, MS4  03/10/2020

## 2020-03-10 NOTE — Interdisciplinary (Addendum)
ED Social Work Note    Referral: resources    Data:   54 y.o.manwith previous diagnoses of bipolar disorder, alcohol and meth useseen in ED for psychiatric evaluation. EDSW following for psych placement, pt boarding in ED and admitted to Medicine for ETOH withdrawal. EDSW self referred for resources.     Assessment:   EDSW met with pt in ED exam room. Pt A&0x3, able to engage in SW interview, a little irritable and requesting to go home. EDSW explained 8101 hold and admission to medical services.     Resources: Pt reports he would like to dc today to his sister's home in [place], [place] at [place]. Pt reports he spends time with her and also resides in [facility].     Mental health: Pt denies having any mental health dx and denies need for mental health treatment referrals. Pt denies SI/HI and has access to food/clotihng/shetler and is requesting dc.     Substance use: Pt reports relapse to ETOH after 4 months sobriety, he reports he is connected with treatment team through [facility] Pt reports he also used meth, last use 4 months ago. Denies need for additional resources.     Contacts:   Sister: Darrick Penna [phone]  [facility] health worker: Janalyn Shy  [address] [place] [address]   [phone]   Plan:   -Medicine Team notified of pt's request to discharge and denying SI/HI.  -EDSW discussed mental health and substance use resources, pt reports he is connect with substance use treatment and denies need for mental health referrals.     Waldon Reining, LCSW  ED social work [phone]  [place]: [phone]    ADDENDUM: 6:15PM    Psych has discontinued 5150. Care Everywhere shows that [facility] has assigned a Museum/gallery conservator to pt, EDSW left VM for CHW. Info on how to access mental health services in [facility] added to AVS.     Per Med Team cross cover plan for admission for ETOH wd.

## 2020-03-10 NOTE — Unmapped (Signed)
[facility]  The 24-hour Behavioral Health Access Line is an Administrator, arts for mental health and substance use services. Call toll-free [phone] for:   New COVID-19 Tele-Health Video Appointments - Client Instructions Using Zoom   Questions about mental health services and supportive recovery resources for substance use disorders.   Assistance finding services.   Insurance questions and referrals to low-cost and sliding scale services if uninsured.   Be prepared to answer questions on your needs and provide your Medi-Cal or other insurance information.   Language interpretation services are available. Call during business hours (8 a.m. to 5 p.m.) weekdays for quickest service.
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
## 2020-03-10 NOTE — Nursing Note (Signed)
Patient arrived onto the unit via gurney. As soon as patient came into his room, he changed into his street clothes and asked for his IV to be taken out or he will pake it out himself. Alerted MD patient wishes to leave AMA. PIV taken out, MD arrived to the unit. Patient left AMA without signing paperwork.

## 2020-03-10 NOTE — Discharge Summary (Signed)
[place]     Patient Name: Tyler Bailey  Patient MRN: [id number]  Date of Birth: [birth year]    Facility: [facility]  Attending Physician: Valetta Mole, MD      Date of Admission: 03/09/2020  Date of Discharge: 03/10/20    Admission Diagnosis: Alcohol withdrawal (CMS code) [F10.239]  Alcohol withdrawal, uncomplicated (CMS code) [E56.314]  Discharge Diagnosis: Alcohol withdrawal (CMS code)    Discharge Disposition: Other: AMA    My date of service is 03/10/20.    History (with Chief Complaint)  54 y/o M with hx of EtOH, cocaine, and amphetamine use disorders (hx EtOH w/d without ICU or seizure) who presented with abdominal pain, chest pain, and SI placed on 5150 by pysch in the ED.     Reports some pinching chest pain this AM now resolved. Currently with abdominal pain, states chronic but unable to further describe character, chronicity, or other details. Per ED provider, had multiple episodes of emesis in ED with small amount of blood.     Variably reporting date of last EtOH and drug use to providers, as recently as the evening prior to admission (6/10 evening) to as remote as five years ago. Variable amounts of EtOH reported, from 6 beers per day to 24 beers plus pint of liquor. Told psych provider he was starting to feel EtOH withdrawal. Denies that on our interview.     Frequent PES visits for paranoia and audio-visual hallucinations and ED visits for chest pain/abdominal pain/vomting (last 01/2020 at [facility]). Recently moved back to [facility] from [place], [place].     In ED, afebrile, sinus tachycardia to 105, HTN to 150-170/90-110, 95-99% on room air. Labs notable for 7.49/30/23/lactate 6. Anion gap 17, Cr 1, Mg 1.7. Negative troponin, non-ischemic EKG. Normal LFTs. Normal INR. WBC nml, Hgb nml, plts 121. Lactate cleared with 1L IVF. Given Lorazepam 2mg  x 3 for c/f EtOH w/d, dilaudid 1mg  IV x 3 for abdominal pain, Risperidone 1mg  PO for psychosis, quetiapine 50mg  for  anxiety, ondansetron 4mg  for nausea/vomiting prior to our interview. CT A/P negative for acute finding.     Medicine was called for admission since patient not appropriate for psych admission due to EtOH withdrawal.       Brief Hospital Course by Problem  Patient is a 54 year old man with polysubstance use, alcohol use disorder who is admitted with SI and alcohol withdrawal.  Patient treated with withdrawal in TCU, clinically improved this morning.   In regards to SI, patient continuing to endorse SI to psych team this morning; wants to leave and states SI was related to losing his cat and dog.  Pysch did reassess his hold this afternoon and released hold. Patient left AMA prior to assessment of primary attending.      During this hospitalization the patient was treated for:  alcohol withdrawal, suicidal ideation  Physical Exam at Discharge  BP (!) 132/97 (BP Location: Right upper arm, Patient Position: Lying)   Pulse 98   Temp 36.6 C (97.8 F) (Oral)   Resp 16   Wt (!) 106.9 kg (235 lb 9.6 oz)   SpO2 96%   BMI 31.07 kg/m     No intake or output data in the 24 hours ending 03/22/20 1535    Physical Exam    Relevant Labs, Radiology, and Other Studies  Lab Results   Component Value Date    NA 139 03/10/2020    K 3.9 03/10/2020  CL 105 03/10/2020    CO2 26 03/10/2020    BUN 9 03/10/2020    CREAT 0.99 03/10/2020    GLU 104 03/10/2020     Key elements of latest CBC values... Please see Chart Review for additional result details.  Lab Results   Component Value Date    WBC Count 4.9 03/10/2020    Hemoglobin 14.5 03/10/2020    Hematocrit 43.9 03/10/2020    MCV 97 03/10/2020    Platelet Count 101 (L) 03/10/2020         Procedures Performed and Complications  none    DISCHARGE INSTRUCTIONS        Allergies and Medications at Discharge    Allergies: Patient has no known allergies.    Your Medications at the End of This Hospitalization       Disp Refills Start End    chlordiazePOXIDE (LIBRIUM) 25 mg capsule 3 capsule  0 03/10/2020     Sig: Take 1 capsule twice a day for 1 day, then 1 capsule once a day for 1 day    Class: Print    Cosign for Ordering: Accepted by Donia Ast, MD on 03/22/2020  3:34 PM    Renewals     Renewal requests to authorizing provider Donia Ast, MD) <b>prohibited</b>          ergocalciferol, vitamin D2, (VITAMIN D) 400 unit CAP        Sig - Route: Take 400 Units by mouth Twice a day.   - Oral    Class: Historical Med    lansoprazole (PREVACID) 30 mg capsule   09/18/2014     Sig - Route: Take 1 capsule (30 mg total) by mouth Daily. - Oral    Class: No Print    Cosign for Ordering: Accepted by Harrie Jeans, MD on 09/22/2014  6:38 PM    mirtazapine (REMERON) 15 mg tablet   09/18/2014     Sig - Route: Take 1 tablet (15 mg total) by mouth nightly at bedtime. - Oral    Class: No Print    Cosign for Ordering: Accepted by Harrie Jeans, MD on 09/22/2014  6:38 PM              Pending Tests     Left AMA    Booked [place] Appointments  No future appointments.      Case Management Services Arranged  Case Management Services Arranged: (all recorded)           Discharge Assessment  Condition at discharge:  fair              Covid-19 Vaccines     No immunizations on file.            Primary Care Physician  Name Unknown Provider  Address:     Phone: None  Fax: None     I spent 25 minutes preparing discharge materials, prescriptions, follow up plans, and face-to-face time with the patient/family discussing AMA discharge with team.    Outside Providers, for pending tests please use the following numbers:   For [facility] - Please Call: [phone]    For [facility] - Please Call: [phone]   For [facility] - Please Call: [phone]    Signed,  Donia Ast, MD  03/22/2020        Discharge Instructions provided to the patient (if any):    Discharge Instructions     Dear Rocco Pauls,    You  were admitted for alcohol withdrawal. To prevent further withdrawal, please take librium 1 tab twice a day  for one day, then 1 tab a day for one day.    RETURN INSTRUCTIONS:  - Please contact a healthcare provider or return to the emergency room for: shakes, confusion, chest pain, bleeding  - If you have questions, please contact your primary care provider or hospital team (phone numbers above).    It was a pleasure taking care of you.                   Patient Instructions       [facility]  The 24-hour Behavioral Health Access Line is an easy-to-use resource for mental health and substance use services. Call toll-free [phone] for:   New COVID-19 Tele-Health Video Appointments - Client Instructions Using Zoom   Questions about mental health services and supportive recovery resources for substance use disorders.   Assistance finding services.   Insurance questions and referrals to low-cost and sliding scale services if uninsured.   Be prepared to answer questions on your needs and provide your Medi-Cal or other insurance information.   Language interpretation services are available. Call during business hours (8 a.m. to 5 p.m.) weekdays for quickest service.

## 2020-03-11 MED FILL — THIAMINE HCL (VITAMIN B1) 100 MG/ML INJECTION SOLUTION: 100 mg/mL | INTRAMUSCULAR | Qty: 5

## 2020-03-11 MED FILL — QUETIAPINE 25 MG TABLET: 25 mg | ORAL | Qty: 2

## 2020-03-12 LAB — ECG 12-LEAD
Atrial Rate: 108 {beats}/min
Calculated P Axis: 63 degrees
Calculated R Axis: 36 degrees
Calculated T Axis: 0 degrees
P-R Interval: 156 ms
QRS Duration: 76 ms
QT Interval: 330 ms
QTcb: 443 ms
Ventricular Rate: 108 {beats}/min

## 2020-03-12 LAB — HIV ANTIBODY AND ANTIGEN COMBI: HIV Ag/Ab Combo: NEGATIVE

## 2021-03-16 ENCOUNTER — Emergency Department (HOSPITAL_COMMUNITY): Payer: Medicaid Other

## 2021-03-16 ENCOUNTER — Other Ambulatory Visit: Payer: Self-pay

## 2021-03-16 ENCOUNTER — Emergency Department (HOSPITAL_COMMUNITY)
Admission: EM | Admit: 2021-03-16 | Discharge: 2021-03-18 | Disposition: A | Discharge status: Home / Self Care | Payer: Medicaid Other | Attending: Emergency Medicine | Admitting: Emergency Medicine

## 2021-03-16 ENCOUNTER — Encounter (HOSPITAL_COMMUNITY): Payer: Self-pay

## 2021-03-16 DIAGNOSIS — R45851 Suicidal ideations: Secondary | ICD-10-CM | POA: Diagnosis not present

## 2021-03-16 DIAGNOSIS — F1010 Alcohol abuse, uncomplicated: Secondary | ICD-10-CM | POA: Diagnosis present

## 2021-03-16 DIAGNOSIS — R0789 Other chest pain: Secondary | ICD-10-CM | POA: Insufficient documentation

## 2021-03-16 DIAGNOSIS — Z20822 Contact with and (suspected) exposure to covid-19: Secondary | ICD-10-CM | POA: Insufficient documentation

## 2021-03-16 DIAGNOSIS — F1910 Other psychoactive substance abuse, uncomplicated: Secondary | ICD-10-CM | POA: Diagnosis present

## 2021-03-16 DIAGNOSIS — R079 Chest pain, unspecified: Secondary | ICD-10-CM

## 2021-03-16 DIAGNOSIS — Z96698 Presence of other orthopedic joint implants: Secondary | ICD-10-CM | POA: Diagnosis not present

## 2021-03-16 DIAGNOSIS — R1013 Epigastric pain: Secondary | ICD-10-CM | POA: Diagnosis not present

## 2021-03-16 DIAGNOSIS — F1999 Other psychoactive substance use, unspecified with unspecified psychoactive substance-induced disorder: Secondary | ICD-10-CM | POA: Diagnosis present

## 2021-03-16 LAB — TROPONIN I (HIGH SENSITIVITY)
Troponin I (High Sensitivity): 10 ng/L (ref <18)
Troponin I (High Sensitivity): 12 ng/L (ref <18)

## 2021-03-16 LAB — CBC
HCT: 38.4 % — ABNORMAL LOW (ref 39.0–52.0)
Hemoglobin: 12.5 g/dL — ABNORMAL LOW (ref 13.0–17.0)
MCH: 32.5 pg (ref 26.0–34.0)
MCHC: 32.6 g/dL (ref 30.0–36.0)
MCV: 99.7 fL (ref 80.0–100.0)
Platelets: 94 10*3/uL — ABNORMAL LOW (ref 150–400)
RBC: 3.85 MIL/uL — ABNORMAL LOW (ref 4.22–5.81)
RDW: 14.8 % (ref 11.5–15.5)
WBC: 3.6 10*3/uL — ABNORMAL LOW (ref 4.0–10.5)
nRBC: 0 % (ref 0.0–0.2)

## 2021-03-16 LAB — BASIC METABOLIC PANEL
Anion gap: 10 (ref 5–15)
BUN: 13 mg/dL (ref 6–20)
CO2: 23 mmol/L (ref 22–32)
Calcium: 8.9 mg/dL (ref 8.9–10.3)
Chloride: 107 mmol/L (ref 98–111)
Creatinine, Ser: 0.99 mg/dL (ref 0.61–1.24)
GFR, Estimated: >60 mL/min (ref >60)
Glucose, Bld: 107 mg/dL — ABNORMAL HIGH (ref 70–99)
Potassium: 3.8 mmol/L (ref 3.5–5.1)
Sodium: 140 mmol/L (ref 135–145)

## 2021-03-16 LAB — RAPID URINE DRUG SCREEN, HOSP PERFORMED
Amphetamines: NOT DETECTED
Barbiturates: POSITIVE — AB
Benzodiazepines: POSITIVE — AB
Cocaine: NOT DETECTED
Opiates: NOT DETECTED
Tetrahydrocannabinol: NOT DETECTED

## 2021-03-16 LAB — RESP PANEL BY RT-PCR (FLU A&B, COVID) ARPGX2
Influenza A by PCR: NEGATIVE
Influenza B by PCR: NEGATIVE
SARS Coronavirus 2 by RT PCR: NEGATIVE

## 2021-03-16 LAB — ACETAMINOPHEN LEVEL: Acetaminophen (Tylenol), Serum: <10 ug/mL — ABNORMAL LOW (ref 10–30)

## 2021-03-16 LAB — ETHANOL: Alcohol, Ethyl (B): 149 mg/dL — ABNORMAL HIGH (ref <10)

## 2021-03-16 LAB — SALICYLATE LEVEL: Salicylate Lvl: <7 mg/dL — ABNORMAL LOW (ref 7.0–30.0)

## 2021-03-16 MED ORDER — ACETAMINOPHEN 325 MG PO TABS
650.0000 mg | ORAL_TABLET | ORAL | Status: DC | PRN
  Filled 2021-03-16: qty 2

## 2021-03-16 MED ORDER — ONDANSETRON HCL 4 MG/2ML IJ SOLN
4.0000 mg | Freq: Once | INTRAMUSCULAR | Status: AC
  Administered 2021-03-16: 4 mg via INTRAVENOUS
  Filled 2021-03-16: qty 2

## 2021-03-16 MED ORDER — FENTANYL CITRATE (PF) 100 MCG/2ML IJ SOLN
50.0000 ug | Freq: Once | INTRAMUSCULAR | Status: AC
  Administered 2021-03-16: 50 ug via INTRAVENOUS
  Filled 2021-03-16: qty 2

## 2021-03-16 MED ORDER — IOHEXOL 350 MG/ML SOLN
100.0000 mL | Freq: Once | INTRAVENOUS | Status: AC | PRN
  Administered 2021-03-16: 100 mL via INTRAVENOUS

## 2021-03-16 MED ORDER — LORAZEPAM 2 MG/ML IJ SOLN
1.0000 mg | Freq: Once | INTRAMUSCULAR | Status: AC
  Administered 2021-03-16: 1 mg via INTRAVENOUS
  Filled 2021-03-16: qty 1

## 2021-03-16 MED ORDER — MORPHINE SULFATE (PF) 4 MG/ML IV SOLN
4.0000 mg | Freq: Once | INTRAVENOUS | Status: AC
  Administered 2021-03-16: 4 mg via INTRAVENOUS
  Filled 2021-03-16: qty 1

## 2021-03-16 MED ORDER — SODIUM CHLORIDE (PF) 0.9 % IJ SOLN
INTRAMUSCULAR | Status: AC
  Filled 2021-03-16: qty 50

## 2021-03-16 NOTE — ED Notes (Signed)
Pt unable to tolerate ambulating at this time. Pt returned to bedside.O2 sat at 88% when pt returned to bed after attempt to ambulate. Pt resting at 93% now. Pt requests pain medication. Pt reports pain in back, face, and rt ankle. Pt states pain is a 7/10.
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
## 2021-03-16 NOTE — ED Notes (Signed)
Pt 02 dipping into high 80s several times, put pt on 2L [place]- O2 >95%

## 2021-03-16 NOTE — ED Triage Notes (Signed)
Pt endorsing SI when asking pt triage questions- states he's felt like this for the past week, he "took a a lot of my meds at once on Tuesday". Pt states he has depression and has not been taking his anti-depressants d/t issues w getting to the pharmacy
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
## 2021-03-16 NOTE — ED Notes (Signed)
Pt ambulated in room. Oxygen saturation remained at 98-99% while ambulating

## 2021-03-16 NOTE — ED Triage Notes (Signed)
Per [facility], c/o chest and epigastric pain since last night. Diaphoretic. Ambulatory.  324mg  Aspirin

## 2021-03-17 DIAGNOSIS — F1910 Other psychoactive substance abuse, uncomplicated: Secondary | ICD-10-CM | POA: Diagnosis present

## 2021-03-17 DIAGNOSIS — F1999 Other psychoactive substance use, unspecified with unspecified psychoactive substance-induced disorder: Secondary | ICD-10-CM | POA: Diagnosis present

## 2021-03-17 DIAGNOSIS — F1010 Alcohol abuse, uncomplicated: Secondary | ICD-10-CM | POA: Diagnosis present

## 2021-03-17 MED ORDER — LORAZEPAM 1 MG PO TABS
0.0000 mg | ORAL_TABLET | Freq: Four times a day (QID) | ORAL | Status: DC
  Administered 2021-03-18: 1 mg via ORAL
  Filled 2021-03-17: qty 1

## 2021-03-17 MED ORDER — THIAMINE HCL 100 MG/ML IJ SOLN
100.0000 mg | Freq: Every day | INTRAMUSCULAR | Status: DC
  Administered 2021-03-17: 100 mg via INTRAVENOUS
  Filled 2021-03-17: qty 2

## 2021-03-17 MED ORDER — LORAZEPAM 2 MG/ML IJ SOLN: 0.0000 mg | Freq: Two times a day (BID) | INTRAMUSCULAR | Status: DC

## 2021-03-17 MED ORDER — THIAMINE HCL 100 MG PO TABS
100.0000 mg | ORAL_TABLET | Freq: Every day | ORAL | Status: DC
  Administered 2021-03-18: 100 mg via ORAL
  Filled 2021-03-17: qty 1

## 2021-03-17 MED ORDER — LORAZEPAM 2 MG/ML IJ SOLN
0.0000 mg | Freq: Four times a day (QID) | INTRAMUSCULAR | Status: DC
  Administered 2021-03-17 – 2021-03-18 (×3): 2 mg via INTRAVENOUS
  Filled 2021-03-17 (×3): qty 1

## 2021-03-17 MED ORDER — LORAZEPAM 1 MG PO TABS: 2.0000 mg | ORAL_TABLET | Freq: Once | ORAL | Status: DC

## 2021-03-17 MED ORDER — LORAZEPAM 1 MG PO TABS: 0.0000 mg | ORAL_TABLET | Freq: Two times a day (BID) | ORAL | Status: DC

## 2021-03-17 NOTE — ED Notes (Signed)
Pt lying in bed sleeping, breathing even and unlabored. Pending IP psych placement. NAD.
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
## 2021-03-17 NOTE — ED Notes (Signed)
Patient was given Ativan 2mg  IV @ 1835. It is too soon to give another dose. Will continue to monitor and reassess patient's CIWA score

## 2021-03-18 NOTE — ED Notes (Addendum)
The patient asked if there were other hospital in the area where he could get treatment. I gave pt discharge paperwork and wrote his sister's number on the top. He tore her phone number off the discharge papers and threw them away. He asked for a bus pass, which I provided. While walking with pt to the exit, he said he feels pain in his chest, and he might have to check back in or sit in the lobby.

## 2021-03-18 NOTE — ED Notes (Signed)
Patient is asking to speak with a doctor. He says he has schizophrenia, he's paranoid, and still withdrawing from alcohol.

## 2021-03-18 NOTE — BH Assessment (Signed)
[facility] Assessment Progress Note   Per Dorena Bodo, NP, this pt does not require psychiatric hospitalization at this time.  Pt presents under IVC initiated by EDP Mancel Bale, MD which has been rescinded by Nelly Rout, MD.  Pt is psychiatrically cleared.  Discharge instructions include referral information for substance abuse treatment programs appropriate for [facility] residents, pt's community of residence.  EDP Jacalyn Lefevre, MD and pt's nurse, Florentina Addison, have been notified.  Doylene Canning, MA Triage Specialist [phone]

## 2021-03-18 NOTE — Discharge Instructions (Addendum)
To help you maintain a sober lifestyle, a substance abuse treatment program may be beneficial to you.  Contact one of the following providers at your earliest opportunity to ask about enrolling their program:  RESIDENTIAL PROGRAMS:       [facility]      [address] [place], [place] [address]      [phone]       [facility]      [address] [place]      [place], [place] [address]      [phone]       [facility]      [address] [place], [place] [address]      [phone]       [facility]      [address] [place], [place] [address]      [phone]       [facility]      [address] [place] [address]      [phone]       [facility]      [address]      [place], [place] [address]      [phone]  OUTPATIENT PROGRAMS:       [facility]      [address]      [place], [place] [address]      [phone]    Your medications were prescribed on 6/17.  You need to pick them up from your pharmacy.

## 2021-03-18 NOTE — ED Notes (Signed)
I let the patient know he was discharged. He appeared upset and insisted he was still suicidal and had anxiety. Now, he is sitting on the side of his bed and rocking forward and back. Previously, he had been lying in bed.
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
## 2021-03-18 NOTE — ED Provider Notes (Signed)
Pt has been reassessed by psych and is psych cleared.  He is not suicidal or homicidal and wants treatment for substance abuse.  He will need to f/u with [facility]. IVC rescinded.   Jacalyn Lefevre, MD 03/18/21 1118
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
## 2023-11-03 LAB — COMPREHENSIVE METABOLIC PANEL (BMP, AST, ALT, T.BILI, ALKP, TP ALB)
AST: 38 U/L (ref 5–44)
Alanine transaminase: 45 U/L (ref 10–61)
Albumin, Serum / Plasma: 4.3 g/dL (ref 3.5–5.0)
Alkaline Phosphatase: 75 U/L (ref 38–108)
Anion Gap: 12 (ref 4–14)
Bilirubin, Total: 0.7 mg/dL (ref 0.2–1.2)
Calcium, total, Serum / Plasma: 9.9 mg/dL (ref 8.4–10.5)
Carbon Dioxide, Total: 20 mmol/L — ABNORMAL LOW (ref 22–29)
Chloride, Serum / Plasma: 104 mmol/L (ref 101–110)
Creatinine: 1.22 mg/dL (ref 0.73–1.24)
Glucose, non-fasting: 133 mg/dL (ref 70–199)
Potassium, Serum / Plasma: 4 mmol/L (ref 3.5–5.0)
Protein, Total, Serum / Plasma: 8.1 g/dL (ref 6.3–8.6)
Sodium, Serum / Plasma: 136 mmol/L (ref 135–145)
Urea Nitrogen, serum/plasma: 13 mg/dL (ref 7–25)
eGFRcr: 69 mL/min/{1.73_m2} (ref >59)

## 2023-11-03 LAB — COMPLETE BLOOD COUNT WITH DIFFERENTIAL
Abs Basophils: 0.03 10*9/L (ref 0.00–0.10)
Abs Eosinophils: 0.02 10*9/L (ref 0.00–0.40)
Abs Imm Granulocytes: 0.02 10*9/L (ref <0.10)
Abs Lymphocytes: 0.81 10*9/L — ABNORMAL LOW (ref 1.00–3.40)
Abs Monocytes: 1.12 10*9/L — ABNORMAL HIGH (ref 0.20–0.80)
Abs Neutrophils: 5.02 10*9/L (ref 1.80–6.80)
Hematocrit: 44.5 % (ref 41.0–53.0)
Hemoglobin: 14.7 g/dL (ref 13.6–17.5)
MCH: 31.7 pg (ref 26.0–34.0)
MCHC: 33 g/dL (ref 31.0–36.0)
MCV: 96 fL (ref 80–100)
MPV: 10 fL (ref 9.1–12.6)
Platelet Count: 140 10*9/L (ref 140–450)
RBC Count: 4.64 10*12/L (ref 4.40–5.90)
RDW-CV: 13.9 % (ref 11.7–14.4)
WBC Count: 7 10*9/L (ref 3.4–10.0)

## 2023-11-03 LAB — VENOUS BLOOD GAS W/LACTATE
Base excess: 0 mmol/L
Bicarbonate: 25 mmol/L (ref 23–29)
Calcium, Ionized, whole blood: 1.22 mmol/L (ref 1.13–1.27)
Chloride, whole blood: 103 mmol/L (ref 99–108)
Glucose, whole blood: 128 mg/dL (ref 70–199)
Hematocrit from Hb: 47 % (ref 41–53)
Hemoglobin, Whole Blood: 15.2 g/dL (ref 13.6–17.5)
Lactate, whole blood: 2.8 mmol/L — ABNORMAL HIGH (ref 0.5–2.0)
Oxygen Saturation: 82 % — ABNORMAL LOW (ref 95–100)
PCO2: 43 mmHg (ref 32–46)
PO2: 48 mmHg — ABNORMAL LOW (ref 83–108)
Potassium, Whole Blood: 3.9 mmol/L (ref 3.4–4.5)
Sodium, whole blood: 139 mmol/L (ref 136–146)
pH, Blood: 7.37 (ref 7.35–7.45)

## 2023-11-03 LAB — ED INFORMATION EXCHANGE ORDER
EDIE Other: 1
EDIE Other: 1

## 2023-11-03 LAB — DRUG SCREEN URINE, RAPID
Amphetamine Screen, Urine: POSITIVE — AB
Barbiturates Screen, Urine: NEGATIVE
Benzodiazepines Screen, Urine: POSITIVE — AB
Cocaine Screen, Urine: NEGATIVE
Fentanyl Screen, Urine: NEGATIVE
Methadone screen, urine: NEGATIVE
Opiates Screen, Urine: NEGATIVE
Oxycodone Screen, Urine: NEGATIVE
Tetrahydrocannabinol Screen, Urine: NEGATIVE

## 2023-11-03 LAB — TROPONIN I
Troponin I: 0.1 ug/L — ABNORMAL HIGH (ref 0.00–0.04)
Troponin I: 0.12 ug/L — ABNORMAL HIGH (ref 0.00–0.04)
Troponin I: 0.11 ug/L — ABNORMAL HIGH (ref 0.00–0.04)

## 2023-11-03 LAB — FIBRIN D-DIMERS: Fibrin D-Dimers: <270 ng{FEU}/mL (ref <500)

## 2023-11-03 LAB — MAGNESIUM, SERUM / PLASMA: Magnesium, Serum / Plasma: 1.9 mg/dL (ref 1.6–2.6)

## 2023-11-03 LAB — B-TYPE NATRIURETIC PEPTIDE: BNP: 20 pg/mL (ref <73)

## 2023-11-03 MED ORDER — ELECTROLYTE-A IV BOLUS
Freq: Once | INTRAVENOUS | Status: AC
Start: 2023-11-03 — End: 2023-11-03
  Administered 2023-11-03: 11:00:00 1000 mL via INTRAVENOUS

## 2023-11-03 MED ORDER — APIXABAN 5 MG TABLET
5 | Freq: Two times a day (BID) | ORAL | Status: AC
Start: 2023-11-03 — End: ?

## 2023-11-03 NOTE — ED Provider Notes (Signed)
EMERGENCY DEPARTMENT PHYSICIAN NOTE      ED Attending(s): Lajuana Ripple [facility] 11/03/23 03:05    Chief Complaint   Patient presents with    Chest Pain     Chest pain and anxiety after smoking meth 2 hours PTA. Auditory hallucinations    Anxiety       HISTORY     Interpreter used? 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 (self reported, don't see AC on his medication list), HLD, PSUD (alcohol and meth) c/b alcohol withdrawal seizures and DT, depression, recently admitted to cardiology from 2/2-2/3 for chest pain, shortness of breath, tachycardia and positive troponin I/s/o alcohol withdrawal and recent meth use, self-directed discharge now re-presenting with 1 day of intermittent chest pain    -on triage note, says that chest pain occurred in the setting of meth use    On my history:  -patient says that over the past day has been having intermittent chest pain  -pain is exertional, worse when walking  -does not radiate  -pain became more constant and severe when cleaning kitchen  -felt like chest pain on 2/2  -took three doses of aspirin 81mg  and came to ED  -denies pleuritic chest pain. Self reports history of bilateral PE on AC (don't see history of this), denies hemoptysis, cancer, recent surgery  -denies f/c, cough, upper URI sx, wheezing  -denies heart failure history, LEE, orthopnea  -denies tobacco use, last drink of alcohol 2 hours PTA, no meth use in three days  -denies feeling like he is in withdrawal  -denies SI/HI    Allergies/Contraindications  No Known Allergies      Medical History    Past Medical History   Diagnosis Date    Alcoholism (CMS code)     Exposure to hepatitis C     Medial epicondylitis of elbow     Osteoarthritis of wrist     Osteoarthrosis, unspecified whether generalized or localized, forearm     Osteoarthrosis, unspecified whether generalized or localized, lower leg     Vitamin D deficiency               Surgical History    No past surgical history  on file.           Family History    No family history on file.          Social History     Socioeconomic History    Marital status: Single   Tobacco Use    Smoking status: Never    Smokeless tobacco: Never   Substance and Sexual Activity    Alcohol use: Yes     Alcohol/week: 18.0 standard drinks of alcohol     Types: 18 Cans of beer per week     Comment: 3 6-packs daily.     Drug use: Yes     Types: Cocaine, Methamphetamine     Comment: smokes (prior cocaine)            REVIEW OF SYSTEMS   Review of Systems    PHYSICAL EXAM     Triage Vital Signs:  BP: (!) 149/85, Heart Rate: 110, Pulse - Palpated/Pleth: (!) 136, Temp: 36.6 C (97.9 F), *Resp: 18, SpO2: 95 %              Physical Exam  Constitutional:       General: He is not in acute distress.     Appearance: He is well-developed.  HENT:      Head: Normocephalic and atraumatic.   Eyes:      Extraocular Movements: Extraocular movements intact.   Cardiovascular:      Rate and Rhythm: Regular rhythm. Tachycardia present.      Pulses:           Radial pulses are 2+ on the right side and 2+ on the left side.      Heart sounds: Normal heart sounds. Heart sounds not distant.      No systolic murmur is present.      No friction rub. No gallop.   Pulmonary:      Effort: Pulmonary effort is normal. No tachypnea.      Breath sounds: Normal breath sounds. No decreased breath sounds, wheezing, rhonchi or rales.   Abdominal:      Palpations: Abdomen is soft.      Tenderness: There is no abdominal tenderness. There is no guarding or rebound.   Musculoskeletal:      Cervical back: Normal range of motion.      Right lower leg: No edema.      Left lower leg: No edema.   Skin:     General: Skin is warm.   Neurological:      General: No focal deficit present.      Mental Status: He is alert and oriented to person, place, and time.      Comments: No tongue wag   No intention tremor   Psychiatric:         Mood and Affect: Mood normal.         Behavior: Behavior normal.          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 Chest pain is exertional. Exam with tachycardia, clear lungs, no LEE    Initial Ddx: Most c/f ACS vs. PE (Wells Score 3, although unclear if he actually has a history of PE) vs. MSK pain. Less c/f ADHF given no e/o VOL, PNA given no fever or cough, COPD/asthma given no wheezing, PTX given bilateral breath sounds, aortic dissection given well appearing and no severe HTN, alcohol withdrawal given no tongue wag or tremor  Plan: Labs, EKG, CXR, IVF, trend CIWA, reassess    Amount and/or Complexity of Data Reviewed  External Data Reviewed: notes.     Details: Reviewed pmh  Labs: ordered. Decision-making details documented in ED Course.  Radiology: ordered and independent interpretation performed. Decision-making details documented in ED Course.  ECG/medicine tests: ordered and independent interpretation performed. Decision-making details documented in ED Course.    Risk  Prescription drug management.  Decision regarding hospitalization.                Final Disposition and ED Course                                ED Course as of 11/03/23 0638   Boyd Kerbs Documentation   Tue Nov 03, 2023   1610 My independent read of EKG: Sinus  tach, no e/o STEMI or ischemia, no arrhythmia, normal intervals   0248 My independent read of CXR: R sided basilar consolidation, normal cardiac contour   0256 Labs notable for no leukocytosis, no anemia, normal platelets, lytes WNL, Cr WNL, LFTs WNL, trop 0.10 (getting repeat, low c/f STEMI), lactate  2.5, will give IVF   0321 IMPRESSION:   FINDINGS/IMPRESSION:     Hazy opacity over the right lower lung, which may reflect a combination of aspiration, atelectasis, or infection.     No pneumothorax or pleural effusion.     Unchanged cardiac mediastinal silhouette.     1610 Troponin I(!!): 0.12  Uptrending, will get repeat   0438 D-dimer neg, BNP WNL   0450 Paged cards   [phone] Patient endorsing SI to nurse, on my assessment, denies. CIWA 6. No tongue wag, no significant hand tremor. Low c/f alcohol withdrawal requiring intervention at this time. Placed on CIWA obs   [id number] Per cards, will evaluate   [phone] Cards will admit      Others' Documentation   Tue Nov 03, 2023   0159 Communicated with triage flow RN re: positive trop and need for room in ED, though none currently available. [JF]      ED Course User Index  [JF] Layne Benton, MD         Clinical Impressions as of 11/03/23 0638   Chest pain, unspecified type                  Brock Bad, MD  Resident  11/03/23 8119       Layne Benton, MD  11/06/23 [phone]

## 2023-11-03 NOTE — Consults (Signed)
PSYCHIATRY INITIAL CONSULTATION NOTE     Author Type: Nurse Practitioner - This is an independent visit and my supervising provider is Dr. Bettey Costa    IDENTIFYING DETAILS/REASON FOR CONSULTATION   Tyler Bailey is a 58 y.o. man with a history of Major Depressive Disorder and Polysubstance Use who is referred by Dr. Brock Bad, MD of the ED service for evaluation of Suicidal Ideation.       HISTORY OF PRESENT ILLNESS   Tyler Bailey is a 58 year-old male with a history of Hyperlipidemia, Major Depressive Disorder c/b chronic suicidal ideation, alcohol use disorder with significant withdrawal syndrome, and stimulant use disorder who was BIBA for chest pain. Patient initially denied SI though later reported SI to ED RN prompting psychiatric consultation. He asserts alcohol and methamphetamine use prior to ED arrival.     Exam reveals a fairly groomed male resting comfortably on gurney who reports that he has been experiencing increased depressive symptoms x 3 weeks in the context of the death of his mother and other loved ones. He reports that he recently moved back to [place] from [facility] and is having a difficult time adjusting. Despite depressive symptoms, he denies SI/HI/AVH. He reports increased anxiety with sensation of palpitations and poor sleep which he attributes to his substance use. He states that his alcohol and amphetamine use has increased in an attempt to manage his mental health symptoms. He reports feeling increasingly isolated and loneliness in the context of his mother death though denies changes to appetite, concentration or sense of self. He asserts persecutory anxiety that he is being following while in the community which he correlates with his stimulant use.     Patient is not currently connected to mental health care as he recently moved back to [facility]. Reports prior trials of Risperdal and Seroquel which he found helpful. He denies the need to start a medication  at this time as "I just want to get through this withdrawal first." He reports that he was given prescriptions for Lexapro and Abilify from an ER a few weeks ago though he never started this regime. He mots recently was prescribed Seroquel or Risperdal with good effect though patient could not recall details of his regime.        PAST MEDICAL HISTORY  Past Medical History:   Diagnosis Date    Alcoholism (CMS code)     Exposure to hepatitis C     Medial epicondylitis of elbow     Osteoarthritis of wrist     Osteoarthrosis, unspecified whether generalized or localized, forearm     Osteoarthrosis, unspecified whether generalized or localized, lower leg     Vitamin D deficiency        PAST PSYCHIATRIC HISTORY   Prior diagnoses:  Major Depressive Disorder, Alcohol Use Disorder, Methamphetamine use disorder.   Outpatient treatment: None currently. See HPI.   Inpatient treatment:  Patient reports multiple lifetime admissions. He reports hospitalization 1-2 years ago at [facility] for depressive symptoms.   Prior medication trials:  Ativan, Librium, Risperdal, Seroquel, Abilify, Lexapro, Trazodone, Zyprexa, and Hydroxyzine.   Suicide attempts and self-injurious behavior (past 6 months and lifetime):  Patient reports aborted SA in 1997 where he was standing on a [employer] and a community member pulled him back. Patient review reveals similar ideation in the last several weeks though patient denies this.   History of violent behavior (past 6 months and lifetime): No reported history in past 6 months nor  lifetime.  Access to weapons: Reports no access.     SUBSTANCE USE HISTORY   Alcohol and Methamphetamine Use 2 hours prior to ED arrival.     MEDICATIONS  (Not in a hospital admission)    Scheduled Meds:  Continuous Infusions:  PRN Meds:    FAMILY HISTORY   Declines pertinent family history.        SOCIAL History   Patient recently moved back to [place] from [facility]. He denies stable housing. Patient is  currently unemployed.        VITALS  BP (!) 149/85   Temp 36.6 C (97.9 F) (Oral)   Resp 18   SpO2 95%     MENTAL STATUS EXAMINATION  General Appearance and Behavior: Cooperative, conversant, engaged, well groomed, and with appropriate eye contact.  Musculoskeletal/Gait: No abnormal movements noted. Normal, steady gait.  Speech: Normal rate, volume, and prosody.  Mood: "Depressed."   Affect: Dysphoric, constricted.  Thought Process: Linear. Goal directed.  Thought Associations: No loosening of associations.  Thought Content: Patient reports they have no suicidal ideation nor homicidal ideation. No delusions expressed.  Perception: No perceptual abnormalities noted.  Level of Consciousness: Alert.  Orientation: Oriented to person, place, time, and situation.  Attention and Concentration: Intact.  Recent Memory: Intact as evidenced by ability to recall details from the past 24 hours.  Remote Memory: Intact as evidenced by ability to recall previous medical issues.  Insight: Fair, as evidenced by his ability to verbalize an understanding.  Judgment: Limited, as evidenced by help-seeking behavior, ability to reason through medical decision making, and not following treatment recommendations.         PSYCHOMETRIC TESTING                     Synopsis [employer] 11/02/2023    23:35   C-SSRS   1. Have you wished you were dead or wished you could go to sleep and not wake up? No   2. Have you had any actual thoughts of killing yourself? No   6. Have you ever done anything, started to do anything, or prepared to do anything to end your life? No   Level of Risk No risk factors identified by the suicide screening tool         COVID-19 Tests  Lab Results   Component Value Date    COVID-19 RNA, RT-PCR/Nucleic Acid Amplification Not detected 03/09/2020       DATA  Most recent relevant labs from past 7 days   Component Value Date/Time    WBC 7.0 11/02/2023 2335    HGB 14.7 11/02/2023 2335    HCT 44.5 11/02/2023 2335    PLT 140  11/02/2023 2335    NA 136 11/02/2023 2335    K 4.0 11/02/2023 2335    CL 104 11/02/2023 2335    CO2 20 (L) 11/02/2023 2335    BUN 13 11/02/2023 2335    CREAT 1.22 11/02/2023 2335    GLU 133 11/02/2023 2335    CA 9.9 11/02/2023 2335    MG 1.9 11/02/2023 2335    AST 38 11/02/2023 2335    ALT 45 11/02/2023 2335    ALKP 75 11/02/2023 2335    TBILI 0.7 11/02/2023 2335    TP 8.1 11/02/2023 2335    ALB 4.3 11/02/2023 2335     XR Chest 1 View AP    Result Date: 11/03/2023  FINDINGS/IMPRESSION: Hazy opacity over the right lower lung, which may  reflect a combination of aspiration, atelectasis, or infection. No pneumothorax or pleural effusion. Unchanged cardiac mediastinal silhouette.     ASSESSMENT AND FORMULATION  Tyler Bailey is a 58 y.o. man with a history of Hyperlipidemia, Major Depressive Disorder c/b chronic suicidal ideation, alcohol use disorder with significant withdrawal syndrome, and stimulant use disorder who was BIBA for evaluation of chest pain. Patient reports increased anxiety and auditory hallucinations in the setting of methamphetamine use. Initially denied SI though later reported vague SI to ED RN prompting psychiatric consultation. MSE reveals a well groomed male with appropriate eye contact who denies SI/HI/AVH. Reports increased depressive symptoms x three weeks in the context of his mother's death, escalating substance use, and discontinuation of psychiatric medications. He asserts poor self and impaired sense of self which he has been self medicating with alcohol and other substances. Initially endorsed AH and PI though psychotic symptoms cleared with ED containment and metabolization of substances. Patient does not meet LPS criteria given lack of SI/HI and ability to formulate a reasonable plan for self care. Although patient's chronic risk for suicide is elevated given gender, substance use and past attempts, his imminent risk is low and documented below. Modifiable factors addressed via  containment, treatment of withdrawal, and discussion of resources. Patient has a well documented history of presenting to care before engaging in self injurious behavior which is further protective Episode consistent with decompensated mood disorder on the setting of recent death of loved ones, loss of housing and ongoing substance use. ED Psychiatry signing off. Please re-consult if additional need arise.     [place] Scale     Level of Risk: No risk factors identified by the suicide screening tool  (11/02/23 2335)    Risk Assessment:  Imminent Risk of Suicide or Serious Self-Injury: Low Risk -- Risk factors include: alcohol or other substance abuse, depression, gender, history of suicide attempt, lack of outpatient care, and loss (relational, social, occupational, financial).  Protective factors include: skills in problem solving, conflict resolution, and nonviolent handling of disputes, willingness to seek help and support, access to a variety of clinical interventions, history of adhering to treatment recommendations and/or prescribed medication regimen, future-oriented talk, and restricted access to firearms or other lethal means of suicide   Imminent Risk of Violence or Homicide: Low Risk -- Risk factors include: current/history of alcohol or other substance abuse, especially PCP or stimulants and psychosis, including paranoia or command hallucinations to harm others.  Protective factors include: lack of any history of harm to others, lack of any history of violent ideation, lack of access to firearms, and sense of community, availability/access to resources and support.      Environmental Safety:  Items to be removed from patient's room: Items removed: None    Need for 1:1 observation: Observation Needed: No    Outside Room Privileges:   Privileges: No, may not leave room      DIAGNOSES   Suicidal Ideation, Major Depressive Disorder, Alcohol Use Disorder, Stimulant Use Disorder      PROBLEM BASED  RECOMMENDATIONS   #Suicidal Ideation  #Major Depressive Disorder  #Alcohol Use Disorder  #Stimulant Use Disorder   -Patient declines standing psychotropic medications at this time.   -Discharge with [facility]   -SUN intervention as amenable     #Legal  -No indication for LPS Hold     #Dispo  -Medicine      Legal/Holds (From admission, onward)      None  Psychiatric Disposition: No psychiatric placement indicated.

## 2023-11-03 NOTE — H&P (Signed)
CARDIOLOGY H&P NOTE - ATTENDING ONLY  CARDIAC HOSPITALIST     My date of service is 11/03/2023.    PCP  Name Unknown Provider  Confirmed with patient? No      Family/Surrogate Contact Info  Colonel, Krauser (Sister)  [phone] [facility] Phone)   Is this contact their surrogate medical decision maker? Yes    Chief Complaint  Chest pain    History of Present Illness  Tyler Bailey is a 58 y.o. male pmh reported prior heart attack in 1990s, HLD, HTN, PSUD (meth, alcohol), depression with prior SA presenting with chest pain. Pain began at 6pm while he was running trying to catch the bus. It was substernal, squeezing, and did not resolve for about 4-5hours. He fell asleep but then presented to the ED given concern about his chest pain. He currently is not having chest pain but feels anxious and a little nauseated. He recalls prior episodes of similar chest pain which are both at rest and exertional.     Just discharged from [facility] 2/2:  " Tyler Bailey is a 58 y.o. male pmh HLD, PSUD, depression with current SI and self-reported prior SA p/w chest pain, SOB, tachycardia, HTN admitted to cardiology for positive troponin pathway with 3 negative troponins due to delta troponin > 6 points, found to be in etoh withdrawal and on 5150 per PES for DTS. His troponin peaked and likely represented demand iso substance use (+amphetamine on utox) and withdrawal. Last drink reportedly 2/1 PM; known hx DT and seizures. Initial CIWA was 18; he received 2mg  x2 ativan, 25mg  total diazepam with last CIWA 1 prior to self-directed discharge. He was initially placed on 5150 for DTS but was dropped on 2/3 per Psychiatry recommendations. He pursued self-directed discharge despite still being within alcohol withdrawal window. We counseled him on risks of death, delirium, seizures. We did not prescribe librium given high risk of returning to etoh use. He was otherwise hemodynamically stable at time of discharge. "    ED Course  Initially tachy 110s,  HTN 150/100->169/107  Labs- troponin 0.10 to 0.12. lactate 2.8.  Utox positive for amphetamines, benzo   CIWA 6  ECG- unchanged from priors  Reported SI to RN, PES saw and, per ED resident Sandford Craze, has cleared patient.    Meds  Apixaban 5mg  bid   Medications reconciled with patient/surrogate? Yes    Physical Exam  Temp:  [36.6 C (97.9 F)] 36.6 C (97.9 F)  Heart Rate:  [93-110] 94  BP: (128-170)/(85-107) 141/96  *Resp:  [12-24] 16  SpO2:  [93 %-96 %] 94 %  O2 Device: None (Room air)    General- no distress, mildly diaphoretic  HEENT- no scleral icterus  CV- RRR no mrg  Pulm- CTAB bilat  Abd- soft, NTND  Ext-  warm no LEE  Neuro-axox3, moves all extremities, no dysarthria     Data  I have reviewed pertinent labs, microbiology, radiology studies, EKGs, and telemetry as relevant.    Pertinent results include:      136 104 13 / 133    4.0 20* 1.22 \     02/03 2335     7.0 \ 14.7 / 140    / 44.5 \    02/03 2335     Lactate        11/02/23  2354   LACTWB 2.8*       BNP        11/02/23  2335   BNP 20  Troponin        11/03/23  0244 11/02/23  2335   TRPI 0.12* 0.10*           Microbiology  Microbiology Results (last 72 hours)       ** No results found for the last 72 hours. **          Imaging  I have personally reviewed and interpreted the following studies   ECG 0500 showing NSR, 90, small Q waves in I and aVL, no STE, no STD, no TWI.  ECG 0100 showing sinus tachy 116 small Q waves in I and aVL, no STE, no STD, no TWI.  ECG 2300 showing sinus tachy 127, small Q waves in I and aVL, no STE, no STD, no TWI.  Overall no significant changes between ECG.  Prior ECG 2021 has similar small Q waves in I and aVL.     XR Chest 1 View AP    Result Date: 11/03/2023  FINDINGS/IMPRESSION: Hazy opacity over the right lower lung, which may reflect a combination of aspiration, atelectasis, or infection. No pneumothorax or pleural effusion. Unchanged cardiac mediastinal silhouette.     Problem-based Assessment & Plan  Tyler Bailey is  a 58 y.o. male pmh of prior MI in 1990s (no intervention, per patient), HLD, HTN, PSUD (meth, alcohol), MDD with prior suicide attempt presenting for chest pain fth elevated troponin.    #Chest pain  #ACS r/o  Describes both chronic and acute chest pain. Chronically says he has exertional chest tightness. While chest pain onset at 1800 was reportedly during activity, it did not go away with rest. Thus will r/o type 1 MI. Suspect he might have underlying stable angina regardless. Other ddx for trop elevation is demand iso withdrawal, possibly recent meth use, or exertion at home prior to arrival. Despite PE hx, has good adherence to his DOAC and ddimer neg. Quality and course not c/w dissection and trop similarly reassures.  - trend trop, ecg   - ASA  - hold of on heparin pending trend  - if downtrend, consider inpt stress test     # Positive lactate, cleared   Lactate elevate on admission, likely 2/2 etoh given recent alcohol use. Hemodynamically stable.  - trend     # MDD with prior suicide attempt  # ?SI  Patient with self-reported suicide attempt 2 years ago (attempted jumping in front of [employer]). Was recently on 5150 for DTS from 2/2-2/3 at [facility]. Seen by PES in ED who recommend no LPS hold.   - appreciate PES  [ ] consider OP medications for MDD     # AUD   # Chart hx etoh withdrawal c/b seizures, DT  Reports drinking 18 beers per day. Last drink reported to be 1 hour prior to ED presentation.  CIWA 6.  - CIWA ativan     # PSUD  Endorses hx alcohol use as above as well as recent meth use.    #?HLD chart history not on statin  #HTN reported hx not on meds  #PE- reports he is on apixaban bid with good adherence    Level of care: Acute Care - obs  Vital Signs: Q4H  Monitoring: Telemetry and CPO  Diet:   DVT prophylaxis: already on therapeutic anticoagulation  Foley: No Foley  Delirium bundle: Not indicated  Dispo: pending resolution of current medical issues ; PT/OT: Not indicated    The patient has the following  conditions and comorbidities:    above  I spent a total time of 85 minutes in this episode of care preparing to see the patient, obtaining and/or reviewing separately obtained history, performing a medically appropriate examination and/or evaluation, counseling and educating patient/family/caregiver, ordering and reviewing medications, tests, or procedures, referring and communicating with other health care professionals, documenting clinical information in the electronic or other health record, and communicating results to the patient/family/caregiver, and/or other care coordination.            Al Corpus, MD

## 2023-11-03 NOTE — Discharge Summary (Signed)
Brief AMA Discharge Note    See same day H&P. Admitted to cardiology for elevated troponin with rise in the setting of chest pain. While he was initially agreeable to stay for workup and consideration of stress test, he later said he needed to leave for work urgently. I met with him at bedside and he was able to explain the risks and benefits of leaving vs staying. I felt he had capacity. I urged him to seek care if he develops chest pain again and to follow up with a PCP. I also recommended he return to care if his alcohol withdrawal worsens. He was able to reiterate these to me. He says he has his meds at home and can coordinate transport. IV removed by RN.    Subsequent troponin result down-trended to 0.11 from 0.12. Overall I favor demand in this clinical context, and query underlying CAD.     Lorita Officer MD  Cardiac Hospitalist

## 2023-11-03 NOTE — ED Triage Notes (Signed)
CARDIOLOGY SERVICE - HOSPITALIST TRIAGE NOTE     Disposition    Admitted to  Cardiology Service      Admitting team: crane   Attending: Lorita Officer MD          I spent a total time of 25 minutes in this episode of care preparing to see the patient, obtaining and/or reviewing separately obtained history, performing a medically appropriate examination and/or evaluation, counseling and educating patient/family/caregiver, ordering and reviewing medications, tests, or procedures, referring and communicating with other health care professionals, documenting clinical information in the electronic or other health record, and communicating results to the patient/family/caregiver, and/or other care coordination.    Al Corpus, MD  Cardiac Hospitalist    I have reviewed the patients chart and spoken to the ED provider where appropriate to determine appropriate disposition for this patient.     Cardiac hospitalists are hospital medicine attendings embedded within the Cardiology service, responsible for triaging admissions 24/7. We do not perform Cardiology consults. Queries beyond the scope of an internal medicine physician are discussed with an on-call Cardiologist.

## 2023-11-08 LAB — ECG 12-LEAD
Atrial Rate: 127 {beats}/min
Atrial Rate: 116 {beats}/min
Atrial Rate: 93 {beats}/min
Atrial Rate: 94 {beats}/min
Calculated P Axis: 39 degrees
Calculated P Axis: 57 degrees
Calculated P Axis: 50 degrees
Calculated P Axis: 50 degrees
Calculated R Axis: -28 degrees
Calculated R Axis: -20 degrees
Calculated R Axis: -24 degrees
Calculated R Axis: -25 degrees
Calculated T Axis: 46 degrees
Calculated T Axis: 54 degrees
Calculated T Axis: 11 degrees
Calculated T Axis: 17 degrees
P-R Interval: 156 ms
P-R Interval: 160 ms
P-R Interval: 178 ms
P-R Interval: 178 ms
QRS Duration: 92 ms
QRS Duration: 92 ms
QRS Duration: 96 ms
QRS Duration: 98 ms
QT Interval: 280 ms
QT Interval: 336 ms
QT Interval: 376 ms
QT Interval: 386 ms
QTcb: 406 ms
QTcb: 467 ms
QTcb: 470 ms
QTcb: 479 ms
Ventricular Rate: 127 {beats}/min
Ventricular Rate: 116 {beats}/min
Ventricular Rate: 93 {beats}/min
Ventricular Rate: 94 {beats}/min

## 2023-11-19 NOTE — ED Provider Notes (Signed)
 EMERGENCY DEPARTMENT PHYSICIAN NOTE      ED Attending(s):     Chief Complaint   Patient presents with    Chest Pain     X2 days L sided chest pressure radiating to L arm, Hx MI distantly, has nitro at home and takes a blood thinner;  324mg  ASA and x1 spray NTG en route    Depression     Pt also reports depression and recent heavy drinking recently, currently residing in shelter x1 month;  Endorsed SI to medics       HISTORY     Interpreter used? (Optional): No    Tyler Bailey is a 58 y.o.male, with HTN, depression/anxiety, alcohol use, remote meth use p/w chest pain   Pt somnolent, history limited   Answers some yes/no questions  Endorses chest pain for 2d  Located to L chest   Not sure if worse with exertion   Endorses shortness of breath   No fevers, cough, leg swelling   Does not respond when asked about SI     Allergies/Contraindications  No Known Allergies      Medical History    Past Medical History   Diagnosis Date    Alcoholism (CMS code)     Exposure to hepatitis C     Medial epicondylitis of elbow     Osteoarthritis of wrist     Osteoarthrosis, unspecified whether generalized or localized, forearm     Osteoarthrosis, unspecified whether generalized or localized, lower leg     Vitamin D deficiency                  Social History     Socioeconomic History    Marital status: Single   Tobacco Use    Smoking status: Never    Smokeless tobacco: Never   Substance and Sexual Activity    Alcohol use: Yes     Alcohol/week: 18.0 standard drinks of alcohol     Types: 18 Cans of beer per week     Comment: 3 6-packs daily.     Drug use: Yes     Types: Cocaine, Methamphetamine     Comment: smokes (prior cocaine)            REVIEW OF SYSTEMS   Review of Systems    PHYSICAL EXAM     Triage Vital Signs:  BP: 116/70, Pulse - Palpated/Pleth: 93, Temp: 36.9 C (98.4 F), *Resp: 16, SpO2: 96 %              Physical Exam  Vitals reviewed.     Constitutional: No acute distress   HEENT: Normocephalic, atraumatic. PERRL,  EOMI. Conjunctiva normal.  Neck: Normal range of motion.   Cardiovascular: Normal rate, regular rhythm. Normal heart sounds.    Pulmonary/Chest: Effort normal without accessory muscle usage. No respiratory distress.   Abdominal: Soft, non-distended. Non-tender.   Musculoskeletal: No deformity. No lower extremity edema.  Neurological: Somnolent, no gross motor or sensory defects noted. No ataxia.   Skin: Skin is warm and dry. No rash on exposed skin.   Psychiatric: Mood appropriate for situation, cooperative.      Medical Decision Making           Medical Decision Making  Ddx includes cardiovascular (ACS, aortic dissection, pericarditis), pulmonary (PE, ptx, pna), GI (boerhaave, pneumomediastinum, GERD), thoracic wall (costochondritis, rib contusion, fx, shingles), referred intrabdominal pain.     Plan:   - ekg  - cxr   - labs including trop   -  pain control   - sober reeval      Amount and/or Complexity of Data Reviewed  Independent Historian: EMS  External Data Reviewed: notes.     Details: For pmh  Labs: ordered. Decision-making details documented in ED Course.  Radiology: ordered and independent interpretation performed. Decision-making details documented in ED Course.  ECG/medicine tests: ordered and independent interpretation performed. Decision-making details documented in ED Course.    Risk  Diagnosis or treatment significantly limited by social determinants of health.                Final Disposition and ED Course                See ED course  Labs w/o acute etiologies of chest pain   Pending sober reeval and reassessment of SI                   ED Course as of 11/20/23 1047   Tyler Bailey's Documentation   Thu Nov 19, 2023   2331 Ethyl Alcohol(!): 0.196   Fri Nov 20, 2023   0039 Troponin I: 0.02  No e/o ischemia   0153 XR Chest 1 View AP  My independent interpretation:   No pna, effusion or ptx    0157 ECG 12 Lead Unit Performed  My independent interpretation:   NSR, no STE or depression      Others'  Documentation   Fri Nov 20, 2023   0207 S 57 y/o hx depression and AUD p/w chest pain and SI to medics  -somnolent here  -reassuring chest pain w/u  -med cleared  [ ]  sober reassessment- assess SI [NH]   [phone] Patient endorses active SI- wants to jump off bart platform, no HI, endorses visual hallucinations with ghosts - will place psych consult [NH]   0430   Patient is medically stable for transfer to an inpatient behavioral health unit as determined by evaluation and exclusion of an urgent or emergent medical condition   [NH]   0701 Candice from psych is seeing patient [NH]   0708 S 57 YOM on ED hold presenting with depression   Somnolent for EtOH  Having SI and auditory hallucinations   Med cleared   Follow up pysch recs  [DE]   [phone] Psych- no need for hold; will work with SW to try and get him to housing resource office to work on getting housed; they will let us know when we can discharge  [NH]   1033 ED SW to reach out to [place] the Psych  ED SW for recs/updates  [DE]   1046 SW and psych has cleared pt to go. Pt will receive resource list and option for cab voucher to coordinated entry  [DE]      ED Course User Index  [DE] Liam Graham, MD  [NH] Fransisco Beau, MD         Clinical Impressions as of 11/20/23 1047   Chest pain, unspecified type                  Clemon Chambers, MD  Resident  11/20/23 0201       Ninfa Linden, MD  11/20/23 1228

## 2023-11-19 NOTE — Nursing Note (Signed)
 Behavioral Health RN Assessment:   This is a 58 y/o M who was BIBA for radiating chest pain to L arm x 2 days and also reported depression with heavy EtOH consumption. He reported SI to medics en route.     En route, he receives ASA 324 mg & NTG spray x 1.    Upon assessment:  - Started having "heart problem" 2 days ago  - SI with plan to "step in front of [employer]" with intent  - Intermittent AH/VH "My life is not worth living"  - Relapsed on meth 4 days ago after 8 years of sobriety  - Consumes heavy amounts of EtOh, with last 11/18/23 "Four 6 packs"  - Living at [facility] x 1 month but "It's dangerous, I don't like it"  - Has family nearby but denies any contact with them, states he does not want them to be aware of his hospitalization  - Dx of "Schizophrenia, Bipolar, PTSD"  - Takes Seroquel 500 mg at bedtime, and unknown dose of Risperdal. States he last took his medications on 11/18/23.  - Has history of SA in '98 where he jumped onto [employer] tracks    Affect: Flat affect and Depressed affect    Behavior: Cooperative, Engaged, No self harm behavior, and No violent behavior    Perception: Patient endorses hallucinations, but no evidence noted on exam.    Medication Adherence:  No behavioral health medications ordered    Restraint Episodes: No restraint episodes this ED encounter    Assistive Devices/Mobility: Independent    Infection Prevention: The patient has been screened for scabies/lice. None identified.    IV Therapy: No    C-SSRS:   C-SSRS      Flowsheet Row ED from 11/19/2023 in [facility] Emergency Department ED from 11/03/2023 in [facility] Emergency Department ED to Hosp-Admission (Discharged) from 03/09/2020 in [facility]   1. Have you wished you were dead or wished you could go to sleep and not wake up? Yes No Yes   2. Have you had any actual thoughts of killing yourself? Yes No Yes   3. Have you been thinking about how you might do this? Yes -- Yes   4. Have you had these thoughts and  had some intention of acting on them? Yes -- No   5. Have you started to work out or worked out the details of how to kill yourself?  Do you intend to carry out this plan? Yes -- No   6. Have you ever done anything, started to do anything, or prepared to do anything to end your life? Yes No No   6a. When have you ever done anything, started to do anything, or prepared to do anything to end your life? Any time in the past, over 3 months ago -- --   Level of Risk Moderate No risk factors identified by the suicide screening tool Moderate            1:1 Needs: Observation Needed: No    Covid Status:   POCT COVID-19 RNA, Qualitative, Rapid   Date Value Ref Range Status   01/22/2020 Not detected Not detected Final     Comment:     (Reported by Lab to Public Health. Physician reporting also required.)  (Results reported to [facility])  This test was developed and its performance characteristics determined by the [facility].  It has not been cleared or approved by the U. S. Food and Drug Administration.  This test has been validated according to FDA Emergency Use Authorization (EUA) guidelines.  FDA review is pending.  Performed at [facility] ED  Performed by Shawna Clamp       COVID-19 RNA, RT-PCR/Nucleic Acid Amplification   Date Value Ref Range Status   03/09/2020 Not detected Not detected Final     Comment:     Test performed at [place].  (Reported by Lab to [facility].)  (Results reported to [facility])          Legal Status:   Legal/Holds (From admission, onward)               ED Hold  Continuous X 24 Hours        Current Hold End Time: 11/20/23 2050      Question:  Reason for ED Hold:  Answer:  Danger to Self (DTS)

## 2023-11-20 LAB — COMPLETE BLOOD COUNT WITH DIFFERENTIAL
Abs Basophils: 0.01 10*9/L (ref 0.00–0.10)
Abs Eosinophils: 0.12 10*9/L (ref 0.00–0.40)
Abs Imm Granulocytes: 0.02 10*9/L (ref <0.10)
Abs Lymphocytes: 2.02 10*9/L (ref 1.00–3.40)
Abs Monocytes: 0.76 10*9/L (ref 0.20–0.80)
Abs Neutrophils: 1.81 10*9/L (ref 1.80–6.80)
Hematocrit: 38.4 % — ABNORMAL LOW (ref 41.0–53.0)
Hemoglobin: 12.6 g/dL — ABNORMAL LOW (ref 13.6–17.5)
MCH: 31.8 pg (ref 26.0–34.0)
MCHC: 32.8 g/dL (ref 31.0–36.0)
MCV: 97 fL (ref 80–100)
MPV: 10.4 fL (ref 9.1–12.6)
Platelet Count: 110 10*9/L — ABNORMAL LOW (ref 140–450)
RBC Count: 3.96 10*12/L — ABNORMAL LOW (ref 4.40–5.90)
RDW-CV: 14.6 % — ABNORMAL HIGH (ref 11.7–14.4)
WBC Count: 4.7 10*9/L (ref 3.4–10.0)

## 2023-11-20 LAB — COMPREHENSIVE METABOLIC PANEL (BMP, AST, ALT, T.BILI, ALKP, TP ALB)
AST: 35 U/L (ref 5–44)
Alanine transaminase: 31 U/L (ref 10–61)
Albumin, Serum / Plasma: 3.3 g/dL — ABNORMAL LOW (ref 3.5–5.0)
Alkaline Phosphatase: 72 U/L (ref 38–108)
Anion Gap: 9 (ref 4–14)
Bilirubin, Total: 0.4 mg/dL (ref 0.2–1.2)
Calcium, total, Serum / Plasma: 9.2 mg/dL (ref 8.4–10.5)
Carbon Dioxide, Total: 22 mmol/L (ref 22–29)
Chloride, Serum / Plasma: 106 mmol/L (ref 101–110)
Creatinine: 1.05 mg/dL (ref 0.73–1.24)
Glucose, non-fasting: 110 mg/dL (ref 70–199)
Potassium, Serum / Plasma: 3.7 mmol/L (ref 3.5–5.0)
Protein, Total, Serum / Plasma: 6.4 g/dL (ref 6.3–8.6)
Sodium, Serum / Plasma: 137 mmol/L (ref 135–145)
Urea Nitrogen, serum/plasma: 12 mg/dL (ref 7–25)
eGFRcr: 82 mL/min/{1.73_m2} (ref >59)

## 2023-11-20 LAB — DRUG SCREEN URINE, RAPID
Amphetamine Screen, Urine: NEGATIVE
Barbiturates Screen, Urine: NEGATIVE
Benzodiazepines Screen, Urine: NEGATIVE
Cocaine Screen, Urine: POSITIVE — AB
Fentanyl Screen, Urine: NEGATIVE
Methadone screen, urine: NEGATIVE
Opiates Screen, Urine: NEGATIVE
Oxycodone Screen, Urine: NEGATIVE
Tetrahydrocannabinol Screen, Urine: NEGATIVE

## 2023-11-20 LAB — ETHANOL, SERUM / PLASMA: Ethanol, serum or plasma: 0.196 g/dL — ABNORMAL HIGH (ref <0.010)

## 2023-11-20 LAB — LIPASE: Lipase: 91 U/L — ABNORMAL HIGH (ref 8–78)

## 2023-11-20 LAB — B-TYPE NATRIURETIC PEPTIDE: BNP: 7 pg/mL (ref <73)

## 2023-11-20 LAB — ED INFORMATION EXCHANGE ORDER
EDIE Other: 1
EDIE Other: 1

## 2023-11-20 LAB — TROPONIN I: Troponin I: 0.02 ug/L (ref 0.00–0.04)

## 2023-11-20 NOTE — Consults (Signed)
 PSYCHIATRY INITIAL CONSULTATION NOTE     Author Type: Nurse Practitioner - This is an independent visit and my supervising provider is Dr. Bettey Costa    IDENTIFYING DETAILS/REASON FOR CONSULTATION   Tyler Bailey is a 58 y.o. man with a history of amphetamine use disorder, MDD, housing insecurity who is referred by Dr Houston Siren, MD of the ED service for evaluation of SI.       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 Starts to cry when stating that he does not want to go back to the shelter. Denies all substance use (per ED provider notes, had endorsed recent significant ETOH consumption). Pt reports past two years of housing insecurity, limited social supports, financial struggles. When asked about reporting that he had been suicidal patient, pt responds "I just said that. I didn't mean it, I'm just really down." Denies past SA, past SI, denies treatment history for psychiatry or substance use. Denies intention or plan to end his life. Would like resources in the community to link to case management and to enrol on housing lists.       PAST MEDICAL HISTORY  Past Medical History:   Diagnosis Date    Alcoholism (CMS code)     Exposure to hepatitis C     Medial epicondylitis of elbow     Osteoarthritis of wrist     Osteoarthrosis, unspecified whether generalized or localized, forearm     Osteoarthrosis, unspecified whether generalized or localized, lower leg     Vitamin D deficiency        PAST PSYCHIATRIC HISTORY  See HPI    SUBSTANCE USE HISTORY   Remote hx amphetamines, recent ETOH use endorsed. Positive cocaine screen 11/20/23    MEDICATIONS  (Not in a hospital admission)    Scheduled Meds:  Continuous Infusions:  PRN Meds:    FAMILY HISTORY   Not discussed       SOCIAL History   Unhoused, limited social support. Works in [employer]. From [place].  Unmarried, no children.        VITALS  BP (!) 138/88 (BP Location: Right upper arm, Patient Position: Lying)   Pulse 83   Temp 36.9 C (98.4 F) (Oral)   Resp 18   SpO2 94%     MENTAL STATUS EXAMINATION  General Appearance and Behavior: Cooperative. Conversant. Well groomed. Fair eye contact.  Musculoskeletal/Gait: No abnormal movements noted. Normal, steady gait.  Speech: Normal rate, volume, and prosody.  Mood: "down".  Affect: Sad/tearful.  Thought Process: Linear. Goal directed.  Thought Associations: No loosening of associations.  Thought Content: Patient reports they have no suicidal ideation nor homicidal ideation. No delusions expressed.  Perception: No perceptual abnormalities noted.  Level of Consciousness: Alert.  Orientation: Oriented to person, place, time, and situation.  Attention and Concentration: Intact.  Recent Memory: Intact as evidenced by ability to recall details from the past 24 hours.  Remote Memory: Intact as evidenced by ability to recall previous medical issues.  Insight: Intact, as evidenced by help seeking .  Judgment: Fair, as evidenced by ability to reason through medical decision making and recent high-risk or self-harming behavior.         PSYCHOMETRIC TESTING                     Synopsis Smart[facility]  11/19/2023    20:49 11/02/2023    23:35   C-SSRS   1. Have you wished you were dead or wished you could go to sleep and not wake up? Yes No   2. Have you had any actual thoughts of killing yourself? Yes No   3. Have you been thinking about how you might do this? Yes    4. Have you had these thoughts and had some intention of acting on them? Yes    5. Have you started to work out or worked out the details of how to kill yourself?  Do you intend to carry out this plan? Yes    6. Have you ever done anything, started to do anything, or prepared to do anything to end your life? Yes No   6a. When have you ever done anything, started to do anything, or prepared to do anything to end your life?  Any time in the past, over 3 months ago    Level of Risk Moderate No risk factors identified by the suicide screening tool         COVID-19 Tests  Lab Results   Component Value Date    COVID-19 RNA, RT-PCR/Nucleic Acid Amplification Not detected 03/09/2020       DATA  Most recent relevant labs from past 7 days   Component Value Date/Time    WBC 4.7 11/19/2023 2155    HGB 12.6 (L) 11/19/2023 2155    HCT 38.4 (L) 11/19/2023 2155    PLT 110 (L) 11/19/2023 2155    NA 137 11/19/2023 2155    K 3.7 11/19/2023 2155    CL 106 11/19/2023 2155    CO2 22 11/19/2023 2155    BUN 12 11/19/2023 2155    CREAT 1.05 11/19/2023 2155    GLU 110 11/19/2023 2155    CA 9.2 11/19/2023 2155    AST 35 11/19/2023 2155    ALT 31 11/19/2023 2155    ALKP 72 11/19/2023 2155    TBILI 0.4 11/19/2023 2155    TP 6.4 11/19/2023 2155    ALB 3.3 (L) 11/19/2023 2155    CALCQTCLEKG 457 11/19/2023 2050     XR Chest 1 View AP    Result Date: 11/20/2023  FINDINGS/IMPRESSION: Low lung volumes. Lungs appear clear. No pleural effusion or pneumothorax. Stable cardiomediastinal silhouette. Report dictated by: Blanche East, MD, signed by: Vito Backers, MD [place] Department of Radiology and Biomedical Imaging     ASSESSMENT AND FORMULATION  Tyler Bailey is a 58 y.o. man with a history of amphetamine use disorder, MDD, housing insecurity who is referred by Dr Houston Siren, MD of the ED service for evaluation of SI. He initially presented to the ED with chest pain, depression and SI endorsed to medics prior to admission. He was not placed on a hold by medics. Patient forthcoming regarding his desire for linkage to services. He is not currently linked to case management or on housing lists in [place]. With regards to sxs of depression, he notes that he is depressed because of current circumstances and had endorsed SI when feeling deeply upset prior to admission. He denies suicidal ideation at this time, denies intent or plan to end life and is  able to contract for no self harm. He feels assured that he will be safe for discharge. Strict ED return precautions discussed with verbalized understanding.     [place] Scale     Level of Risk: Moderate  (11/19/23 2049)  Risk Assessment:  Imminent Risk of Suicide or Serious Self-Injury: Low Risk -- Risk factors include: age, depression, gender, lack of outpatient care, and lack of social supports.  Protective factors include: skills in problem solving, conflict resolution, and nonviolent handling of disputes, willingness to seek help and support, access to a variety of clinical interventions, and future-oriented talk   Imminent Risk of Violence or Homicide: Low Risk -- Risk factors include: none identified.  Protective factors include: lack of any history of harm to others, lack of any history of violent ideation, lack of access to firearms, and sense of optimism, hope.      Environmental Safety:  Items to be removed from patient's room: Items removed: None    Need for 1:1 observation: Observation Needed: No    Outside Room Privileges:   Privileges: No, may not leave room      DIAGNOSES   Adjustment disorder  Housing instability  Suicidal ideation     PROBLEM BASED RECOMMENDATIONS   #Adjustment disorder  -Encouraged to follow up with drop in mental health services in [facility].  -Given community resource list    #Suicide Prevention  - Pt verbalizes willingness to alert family, friends, or health care provider if were to become suicidal in future  - Linked with ongoing mental health care after discharge: provided community based resources  - Reviewed firearm safety with patient; denies access presently  - Reviewed medication safety and allowing family and/or friends to control access to prescriptions if patient feeling unsafe  - Reviewed 24hr National Suicide Prevention Lifeline [phone])   - Reviewed return precautions with pt, including increased suicidal thoughts/plans, homicidal thoughts, worsening hallucinations, or  any other symptoms that concern patient. Instructed to call 911 or visit nearest emergency department in the event of above       #Legal  -No LPS critieria met, no indication for hold.  Dispo to community with resource list.     Legal/Holds (From admission, onward)               ED Hold  Continuous X 24 Hours        Current Hold End Time: 11/20/23 2050      Question:  Reason for ED Hold:  Answer:  Danger to Self (DTS)                         Psychiatric Disposition: No psychiatric placement indicated.

## 2023-11-20 NOTE — Nursing Note (Signed)
 Behavioral Health RN Assessment:     Tyler Bailey is a 57 yo man with hx depression BIBA from shelter c/o radiating chest pain to L arm and +SI. Patient was placed on an ED hold. He is medically cleared.    This morning, patient was alert and oriented and denied SI/HIAVH. Requested to go to [employer] for housing assistance. Cleared by PMHNP for discharge.     Plan to provide transport to requested social services agency once discharged by medical team.    Affect: Euthymic affect    Behavior: Cooperative    Perception: No perceptual abnormalities noted.    Medication Adherence:  No behavioral health medications ordered    Restraint Episodes: No restraint episodes this ED encounter    Assistive Devices/Mobility: Independent    Infection Prevention: The patient has been screened for scabies/lice. None identified.    IV Therapy: Yes, PIV L arm    C-SSRS:   C-SSRS      Flowsheet Row ED from 11/19/2023 in [facility] Emergency Department ED from 11/03/2023 in [facility] Emergency Department ED to Hosp-Admission (Discharged) from 03/09/2020 in [facility]   1. Have you wished you were dead or wished you could go to sleep and not wake up? Yes No Yes   2. Have you had any actual thoughts of killing yourself? Yes No Yes   3. Have you been thinking about how you might do this? Yes -- Yes   4. Have you had these thoughts and had some intention of acting on them? Yes -- No   5. Have you started to work out or worked out the details of how to kill yourself?  Do you intend to carry out this plan? Yes -- No   6. Have you ever done anything, started to do anything, or prepared to do anything to end your life? Yes No No   6a. When have you ever done anything, started to do anything, or prepared to do anything to end your life? Any time in the past, over 3 months ago -- --   Level of Risk Moderate No risk factors identified by the suicide screening tool Moderate            1:1 Needs: Observation Needed: No    Covid  Status:   POCT COVID-19 RNA, Qualitative, Rapid   Date Value Ref Range Status   01/22/2020 Not detected Not detected Final     Comment:     (Reported by Lab to Public Health. Physician reporting also required.)  (Results reported to [facility])  This test was developed and its performance characteristics determined by the [facility].  It has not been cleared or approved by the U. S. Food and Drug Administration.  This test has been validated according to FDA Emergency Use Authorization (EUA) guidelines.  FDA review is pending.  Performed at [facility]  Performed by Shawna Clamp       COVID-19 RNA, RT-PCR/Nucleic Acid Amplification   Date Value Ref Range Status   03/09/2020 Not detected Not detected Final     Comment:     Test performed at [place].  (Reported by Lab to [facility].)  (Results reported to [facility])          Legal Status:   Legal/Holds (From admission, onward)               ED Hold  Continuous X 24 Hours        Current Hold End  Time: 11/20/23 2050      Question:  Reason for ED Hold:  Answer:  Danger to Self (DTS)

## 2023-11-20 NOTE — Discharge Instructions (Signed)
 You were cleared to go by our emergency department team   Please take your medications as prescribed  Please seek emergency care if you have fever, chest pain, shortness of breath, abdominal pain, nausea, vomiting, diarrhea or if you have any other concerns

## 2023-11-24 LAB — ECG 12-LEAD
Atrial Rate: 89 {beats}/min
Calculated P Axis: 42 degrees
Calculated R Axis: -15 degrees
Calculated T Axis: 40 degrees
P-R Interval: 182 ms
QRS Duration: 98 ms
QT Interval: 376 ms
QTcb: 457 ms
Ventricular Rate: 89 {beats}/min

## 2023-11-28 DEATH — deceased
